# Patient Record
Sex: Female | Born: 1935 | Race: White | Hispanic: No | Marital: Married | Smoking: Never smoker
Health system: Southern US, Community
[De-identification: ages and names within clinical notes are randomized; demographics above are authoritative.]

## PROBLEM LIST (undated history)

## (undated) DIAGNOSIS — S83209A Unspecified tear of unspecified meniscus, current injury, unspecified knee, initial encounter: Secondary | ICD-10-CM

## (undated) DIAGNOSIS — R351 Nocturia: Secondary | ICD-10-CM

## (undated) DIAGNOSIS — R3915 Urgency of urination: Secondary | ICD-10-CM

## (undated) DIAGNOSIS — F0390 Unspecified dementia without behavioral disturbance: Secondary | ICD-10-CM

## (undated) HISTORY — PX: CATARACT EXTRACTION W/ INTRAOCULAR LENS  IMPLANT, BILATERAL: SHX1307

## (undated) HISTORY — PX: DILATION AND CURETTAGE OF UTERUS: SHX78

---

## 2006-03-02 ENCOUNTER — Ambulatory Visit: Payer: Self-pay | Admitting: Family Medicine

## 2013-02-18 ENCOUNTER — Other Ambulatory Visit: Payer: Self-pay | Admitting: Orthopedic Surgery

## 2013-02-24 ENCOUNTER — Encounter (HOSPITAL_BASED_OUTPATIENT_CLINIC_OR_DEPARTMENT_OTHER): Payer: Self-pay | Admitting: *Deleted

## 2013-02-24 NOTE — Progress Notes (Signed)
SPOKE W/ PT DAUGHTER, [name]. NPO AFTER MN WITH EXCEPTION CLEAR LIQUIDS UNTIL 0800 (NO CREAM/ MILK PRODUCTS). ARRIVES AT 1215. NEEDS HG.

## 2013-02-26 MED ORDER — DIAZEPAM 5 MG/ML IJ SOLN
INTRAMUSCULAR | Status: AC
  Filled 2013-02-26: qty 2

## 2013-03-04 ENCOUNTER — Ambulatory Visit (HOSPITAL_BASED_OUTPATIENT_CLINIC_OR_DEPARTMENT_OTHER): Payer: Medicare Other | Admitting: Anesthesiology

## 2013-03-04 ENCOUNTER — Ambulatory Visit (HOSPITAL_BASED_OUTPATIENT_CLINIC_OR_DEPARTMENT_OTHER)
Admission: RE | Admit: 2013-03-04 | Discharge: 2013-03-04 | Disposition: A | Discharge status: Home / Self Care | Payer: Medicare Other | Source: Ambulatory Visit | Attending: Orthopedic Surgery | Admitting: Orthopedic Surgery

## 2013-03-04 ENCOUNTER — Encounter (HOSPITAL_BASED_OUTPATIENT_CLINIC_OR_DEPARTMENT_OTHER): Payer: Self-pay | Admitting: Anesthesiology

## 2013-03-04 ENCOUNTER — Encounter (HOSPITAL_BASED_OUTPATIENT_CLINIC_OR_DEPARTMENT_OTHER)
Admission: RE | Disposition: A | Discharge status: Home / Self Care | Payer: Self-pay | Source: Ambulatory Visit | Attending: Orthopedic Surgery

## 2013-03-04 ENCOUNTER — Encounter (HOSPITAL_BASED_OUTPATIENT_CLINIC_OR_DEPARTMENT_OTHER): Payer: Self-pay | Admitting: *Deleted

## 2013-03-04 DIAGNOSIS — R351 Nocturia: Secondary | ICD-10-CM | POA: Insufficient documentation

## 2013-03-04 DIAGNOSIS — S83289A Other tear of lateral meniscus, current injury, unspecified knee, initial encounter: Secondary | ICD-10-CM | POA: Insufficient documentation

## 2013-03-04 DIAGNOSIS — Z7982 Long term (current) use of aspirin: Secondary | ICD-10-CM | POA: Insufficient documentation

## 2013-03-04 DIAGNOSIS — D649 Anemia, unspecified: Secondary | ICD-10-CM | POA: Insufficient documentation

## 2013-03-04 DIAGNOSIS — Z9889 Other specified postprocedural states: Secondary | ICD-10-CM

## 2013-03-04 DIAGNOSIS — IMO0002 Reserved for concepts with insufficient information to code with codable children: Secondary | ICD-10-CM | POA: Insufficient documentation

## 2013-03-04 DIAGNOSIS — R3915 Urgency of urination: Secondary | ICD-10-CM | POA: Insufficient documentation

## 2013-03-04 HISTORY — PX: KNEE ARTHROSCOPY WITH LATERAL MENISECTOMY: SHX6193

## 2013-03-04 HISTORY — DX: Urgency of urination: R39.15

## 2013-03-04 HISTORY — DX: Nocturia: R35.1

## 2013-03-04 HISTORY — DX: Unspecified tear of unspecified meniscus, current injury, unspecified knee, initial encounter: S83.209A

## 2013-03-04 SURGERY — ARTHROSCOPY, KNEE, WITH LATERAL MENISCECTOMY
Anesthesia: General | Site: Knee | Laterality: Left | Wound class: Clean

## 2013-03-04 MED ORDER — BUPIVACAINE-EPINEPHRINE 0.5% -1:200000 IJ SOLN
INTRAMUSCULAR | Status: DC | PRN
  Administered 2013-03-04: 20 mL

## 2013-03-04 MED ORDER — MORPHINE SULFATE 4 MG/ML IJ SOLN
INTRAMUSCULAR | Status: DC | PRN
  Administered 2013-03-04: 4 mg via INTRAVENOUS

## 2013-03-04 MED ORDER — POVIDONE-IODINE 7.5 % EX SOLN
Freq: Once | CUTANEOUS | Status: DC
  Filled 2013-03-04: qty 118

## 2013-03-04 MED ORDER — DEXAMETHASONE SODIUM PHOSPHATE 4 MG/ML IJ SOLN
INTRAMUSCULAR | Status: DC | PRN
  Administered 2013-03-04: 8 mg via INTRAVENOUS

## 2013-03-04 MED ORDER — SODIUM CHLORIDE 0.9 % IR SOLN
Status: DC | PRN
  Administered 2013-03-04: 16:00:00

## 2013-03-04 MED ORDER — ONDANSETRON HCL 4 MG/2ML IJ SOLN
INTRAMUSCULAR | Status: DC | PRN
  Administered 2013-03-04: 4 mg via INTRAVENOUS

## 2013-03-04 MED ORDER — SODIUM CHLORIDE 0.9 % IR SOLN
Status: DC | PRN
  Administered 2013-03-04: 3000 mL

## 2013-03-04 MED ORDER — LACTATED RINGERS IV SOLN
INTRAVENOUS | Status: DC
  Administered 2013-03-04: 13:00:00 via INTRAVENOUS
  Filled 2013-03-04: qty 1000

## 2013-03-04 MED ORDER — HYDROCODONE-ACETAMINOPHEN 5-325 MG PO TABS: 1.0000 | ORAL_TABLET | Freq: Four times a day (QID) | ORAL | Status: DC | PRN

## 2013-03-04 MED ORDER — LIDOCAINE HCL (CARDIAC) 20 MG/ML IV SOLN
INTRAVENOUS | Status: DC | PRN
  Administered 2013-03-04: 40 mg via INTRAVENOUS

## 2013-03-04 MED ORDER — PROPOFOL 10 MG/ML IV BOLUS
INTRAVENOUS | Status: DC | PRN
  Administered 2013-03-04: 80 mg via INTRAVENOUS

## 2013-03-04 MED ORDER — FENTANYL CITRATE 0.05 MG/ML IJ SOLN
INTRAMUSCULAR | Status: DC | PRN
  Administered 2013-03-04 (×3): 25 ug via INTRAVENOUS

## 2013-03-04 SURGICAL SUPPLY — 51 items
BANDAGE ELASTIC 6 VELCRO ST LF (GAUZE/BANDAGES/DRESSINGS) ×2 IMPLANT
BANDAGE ESMARK 6X9 LF (GAUZE/BANDAGES/DRESSINGS) ×1 IMPLANT
BANDAGE GAUZE ELAST BULKY 4 IN (GAUZE/BANDAGES/DRESSINGS) ×2 IMPLANT
BLADE 4.2CUDA (BLADE) IMPLANT
BLADE CUDA 5.5 (BLADE) IMPLANT
BLADE CUDA SHAVER 3.5 (BLADE) ×2 IMPLANT
BLADE CUTTER GATOR 3.5 (BLADE) IMPLANT
BLADE GREAT WHITE 4.2 (BLADE) IMPLANT
BNDG CMPR 9X6 STRL LF SNTH (GAUZE/BANDAGES/DRESSINGS) ×1
BNDG ESMARK 6X9 LF (GAUZE/BANDAGES/DRESSINGS) ×2
CANISTER SUCT LVC 12 LTR MEDI- (MISCELLANEOUS) ×2 IMPLANT
CANISTER SUCTION 1200CC (MISCELLANEOUS) ×2 IMPLANT
CLOTH BEACON ORANGE TIMEOUT ST (SAFETY) ×2 IMPLANT
DRAPE ARTHROSCOPY W/POUCH 114 (DRAPES) ×2 IMPLANT
DRAPE LG THREE QUARTER DISP (DRAPES) ×2 IMPLANT
DRSG EMULSION OIL 3X3 NADH (GAUZE/BANDAGES/DRESSINGS) ×2 IMPLANT
DRSG PAD ABDOMINAL 8X10 ST (GAUZE/BANDAGES/DRESSINGS) ×2 IMPLANT
DURAPREP 26ML APPLICATOR (WOUND CARE) ×2 IMPLANT
ELECT MENISCUS 165MM 90D (ELECTRODE) IMPLANT
ELECT REM PT RETURN 9FT ADLT (ELECTROSURGICAL)
ELECTRODE REM PT RTRN 9FT ADLT (ELECTROSURGICAL) IMPLANT
GLOVE BIO SURGEON STRL SZ 6.5 (GLOVE) ×2 IMPLANT
GLOVE BIO SURGEON STRL SZ7.5 (GLOVE) ×2 IMPLANT
GLOVE BIOGEL M STER SZ 6 (GLOVE) ×2 IMPLANT
GLOVE BIOGEL PI IND STRL 8 (GLOVE) ×1 IMPLANT
GLOVE BIOGEL PI INDICATOR 8 (GLOVE) ×1
GLOVE ECLIPSE 8.0 STRL XLNG CF (GLOVE) ×2 IMPLANT
GLOVE INDICATOR 8.0 STRL GRN (GLOVE) IMPLANT
GOWN PREVENTION PLUS LG XLONG (DISPOSABLE) ×2 IMPLANT
GOWN STRL REIN XL XLG (GOWN DISPOSABLE) ×2 IMPLANT
IV NS IRRIG 3000ML ARTHROMATIC (IV SOLUTION) ×6 IMPLANT
KNEE WRAP E Z 3 GEL PACK (MISCELLANEOUS) ×2 IMPLANT
NDL SAFETY ECLIPSE 18X1.5 (NEEDLE) ×1 IMPLANT
NEEDLE HYPO 18GX1.5 BLUNT FILL (NEEDLE) ×2 IMPLANT
NEEDLE HYPO 18GX1.5 SHARP (NEEDLE) ×1
PACK ARTHROSCOPY DSU (CUSTOM PROCEDURE TRAY) ×2 IMPLANT
PACK BASIN DAY SURGERY FS (CUSTOM PROCEDURE TRAY) ×2 IMPLANT
PADDING CAST ABS 4INX4YD NS (CAST SUPPLIES) ×1
PADDING CAST ABS COTTON 4X4 ST (CAST SUPPLIES) ×1 IMPLANT
PENCIL BUTTON HOLSTER BLD 10FT (ELECTRODE) IMPLANT
SET ARTHROSCOPY TUBING (MISCELLANEOUS) ×2
SET ARTHROSCOPY TUBING LN (MISCELLANEOUS) ×1 IMPLANT
SPONGE GAUZE 4X4 12PLY (GAUZE/BANDAGES/DRESSINGS) ×2 IMPLANT
SUT ETHIBOND 2 OS 4 DA (SUTURE) IMPLANT
SUT ETHILON 4 0 PS 2 18 (SUTURE) ×2 IMPLANT
SUT VIC AB 0 CT1 36 (SUTURE) IMPLANT
SUT VIC AB 2-0 PS2 27 (SUTURE) IMPLANT
SYRINGE 10CC LL (SYRINGE) ×2 IMPLANT
TOWEL OR 17X24 6PK STRL BLUE (TOWEL DISPOSABLE) ×2 IMPLANT
WAND 90 DEG TURBOVAC W/CORD (SURGICAL WAND) IMPLANT
WATER STERILE IRR 500ML POUR (IV SOLUTION) ×2 IMPLANT

## 2013-03-04 NOTE — Anesthesia Procedure Notes (Signed)
Procedure Name: LMA Insertion Date/Time: 03/04/2013 3:58 PM Performed by: Maris Berger T Pre-anesthesia Checklist: Patient identified, Emergency Drugs available, Suction available and Patient being monitored Patient Re-evaluated:Patient Re-evaluated prior to inductionOxygen Delivery Method: Circle System Utilized Preoxygenation: Pre-oxygenation with 100% oxygen Intubation Type: IV induction Ventilation: Mask ventilation without difficulty LMA: LMA inserted LMA Size: 4.0 Number of attempts: 1 Placement Confirmation: positive ETCO2 Dental Injury: Teeth and Oropharynx as per pre-operative assessment  Comments: Gauze roll between teeth

## 2013-03-04 NOTE — Anesthesia Postprocedure Evaluation (Signed)
  Anesthesia Post-op Note  Patient: Diane Santana  Procedure(s) Performed: Procedure(s) (LRB): LEFT KNEE ARTHROSCOPY PARTIAL MEDIAL AND LATERAL   MENISCECTOMY (Left)  Patient Location: PACU  Anesthesia Type: General  Level of Consciousness: awake and alert   Airway and Oxygen Therapy: Patient Spontanous Breathing  Post-op Pain: mild  Post-op Assessment: Post-op Vital signs reviewed, Patient's Cardiovascular Status Stable, Respiratory Function Stable, Patent Airway and No signs of Nausea or vomiting  Last Vitals:  Filed Vitals:   03/04/13 1745  BP: 144/62  Pulse: 90  Temp:   Resp: 20    Post-op Vital Signs: stable   Complications: No apparent anesthesia complications

## 2013-03-04 NOTE — H&P (Signed)
Diane Santana is an 79 y.o. female.   Chief Complaint:painful left knee HPIMRI demonstrates tears of both menisci  Past Medical History  Diagnosis Date  . Acute meniscal tear of knee     left knee  . Urgency of urination   . Nocturia     Past Surgical History  Procedure Laterality Date  . Cataract extraction w/ intraocular lens  implant, bilateral    . Dilation and curettage of uterus  2004 (APPROX)    History reviewed. No pertinent family history. Social History:  reports that she has never smoked. She has never used smokeless tobacco. She reports that she does not drink alcohol or use illicit drugs.  Allergies: No Known Allergies  Medications Prior to Admission  Medication Sig Dispense Refill  . acetaminophen (TYLENOL) 325 MG tablet Take 650 mg by mouth every 6 (six) hours as needed for pain.      Marland Kitchen aspirin 81 MG tablet Take 81 mg by mouth daily.        Results for orders placed during the hospital encounter of 03/04/13 (from the past 48 hour(s))  POCT HEMOGLOBIN-HEMACUE     Status: Abnormal   Collection Time    03/04/13  1:13 PM      Result Value Range   Hemoglobin 10.9 (*) 12.0 - 15.0 g/dL   No results found.  ROS  Blood pressure 121/62, pulse 92, temperature 97.2 F (36.2 C), temperature source Oral, height 5' 2.5" (1.588 m), weight 45.36 kg (100 lb), SpO2 99.00%. Physical Exam  Constitutional: She is oriented to person, place, and time. She appears well-developed and well-nourished.  HENT:  Head: Normocephalic and atraumatic.  Right Ear: External ear normal.  Left Ear: External ear normal.  Nose: Nose normal.  Mouth/Throat: Oropharynx is clear and moist.  Eyes: Conjunctivae and EOM are normal. Pupils are equal, round, and reactive to light.  Neck: Normal range of motion. Neck supple.  Cardiovascular: Normal rate, regular rhythm, normal heart sounds and intact distal pulses.   Respiratory: Effort normal and breath sounds normal.  GI: Soft. Bowel sounds are  normal.  Musculoskeletal: Normal range of motion. She exhibits tenderness.  Left knee--tender both joint lines.  Edema  Neurological: She is alert and oriented to person, place, and time. She has normal reflexes.  Skin: Skin is warm and dry.  Psychiatric: She has a normal mood and affect. Her behavior is normal. Judgment and thought content normal.     Assessment/Plan Torn medial and lateral menisci lt knee Lt knee arthroscopy with partial medial and lateral meniscectomies  APLINGTON,JAMES P 03/04/2013, 3:38 PM

## 2013-03-04 NOTE — Transfer of Care (Signed)
Immediate Anesthesia Transfer of Care Note  Patient: Diane Santana  Procedure(s) Performed: Procedure(s): LEFT KNEE ARTHROSCOPY PARTIAL MEDIAL AND LATERAL   MENISCECTOMY (Left)  Patient Location: PACU  Anesthesia Type:General  Level of Consciousness: sedated, patient cooperative and responds to stimulation  Airway & Oxygen Therapy: Patient Spontanous Breathing and Patient connected to nasal cannula oxygen  Post-op Assessment: Report given to PACU RN  Post vital signs: Reviewed and stable  Complications: No apparent anesthesia complications

## 2013-03-04 NOTE — Anesthesia Preprocedure Evaluation (Addendum)
Anesthesia Evaluation  Patient identified by MRN, date of birth, ID band Patient awake    Reviewed: Allergy & Precautions, H&P , NPO status , Patient's Chart, lab work & pertinent test results  Airway Mallampati: II TM Distance: >3 FB Neck ROM: full    Dental no notable dental hx. (+) Teeth Intact and Dental Advisory Given   Pulmonary neg pulmonary ROS,  breath sounds clear to auscultation  Pulmonary exam normal       Cardiovascular Exercise Tolerance: Good negative cardio ROS  Rhythm:regular Rate:Normal     Neuro/Psych negative neurological ROS  negative psych ROS   GI/Hepatic negative GI ROS, Neg liver ROS,   Endo/Other  negative endocrine ROS  Renal/GU negative Renal ROS  negative genitourinary   Musculoskeletal   Abdominal   Peds  Hematology negative hematology ROS (+) Blood dyscrasia, anemia , Hgb. 10.9   Anesthesia Other Findings   Reproductive/Obstetrics negative OB ROS                           Anesthesia Physical Anesthesia Plan  ASA: II  Anesthesia Plan: General   Post-op Pain Management:    Induction: Intravenous  Airway Management Planned: LMA  Additional Equipment:   Intra-op Plan:   Post-operative Plan:   Informed Consent: I have reviewed the patients History and Physical, chart, labs and discussed the procedure including the risks, benefits and alternatives for the proposed anesthesia with the patient or authorized representative who has indicated his/her understanding and acceptance.   Dental Advisory Given  Plan Discussed with: CRNA and Surgeon  Anesthesia Plan Comments:        Anesthesia Quick Evaluation

## 2013-03-05 ENCOUNTER — Encounter (HOSPITAL_BASED_OUTPATIENT_CLINIC_OR_DEPARTMENT_OTHER): Payer: Self-pay | Admitting: Orthopedic Surgery

## 2013-03-05 NOTE — Op Note (Signed)
NAME:  Diane Santana, Diane Santana NO.:  [id number]  MEDICAL RECORD NO.:  [id number]  LOCATION:                                 FACILITY:  PHYSICIAN:  Marlowe Kays, M.D.       DATE OF BIRTH:  DATE OF PROCEDURE: DATE OF DISCHARGE:                              OPERATIVE REPORT   PREOPERATIVE DIAGNOSIS:  Torn medial and lateral menisci, left knee.  POSTOPERATIVE DIAGNOSIS:  Torn medial and lateral menisci, left knee.  OPERATION:  Left knee arthroscopy with, 1. Partial medial and lateral meniscectomies 2. Shaving of medial femoral condyle.  SURGEON:  Marlowe Kays, M.D.  ASSISTANT:  Nurse.  ANESTHESIA:  General.  PATHOLOGY AND JUSTIFICATION FOR PROCEDURE:  Painful left knee with the MRI demonstrating the tears of both menisci.  PROCEDURE IN DETAIL:  Satisfactory general anesthesia, Ace wrap and leg support to right lower extremity.  Pneumatic tourniquet applied to the left lower extremity with a leg Esmarch out nonsterilely.  Thigh stabilizer applied.  The left leg was then prepped with DuraPrep from stabilizer to ankle and draped in sterile field.  Time-out performed. Superior medial saline inflow.  First, through an anterolateral portal, medial compartment joint was evaluated.  She had very slight wear of the medial femoral condyle which I gently smoothed down.  She had a small tear of the posterior curve of the medial meniscus which I trimmed back to a stable rim.  ACL was intact.  Looking up in suprapatellar area medially, her patella had minimal wear.  I then reversed portals.  Most of her pathology was in the lateral knee for she had a bucket-handle tear of the anterior horn and tearing of the remainder of the medial meniscus which I trimmed back to stable rim with a combination of baskets and a 3.5 shaver.  The joint was then irrigated to clear all fluid possible was removed.  I closed the 2 entry portals with 4-0 nylon and then injected 20 mL of 0.5%  Marcaine with adrenaline and 4 mg of morphine through the inflow apparatus, and I closed this portal with 4-0 nylon as well.  Betadine, Adaptic, dry sterile dressing were applied.  Tourniquet was released. She tolerated the procedure well and was taken to the recovery room in satisfactory condition with no known complications.          ______________________________ Marlowe Kays, M.D.     JA/MEDQ  D:  03/04/2013  T:  03/05/2013  Job:  [id number]

## 2013-04-09 ENCOUNTER — Telehealth: Payer: Self-pay | Admitting: General Practice

## 2013-04-09 ENCOUNTER — Encounter: Payer: Self-pay | Admitting: General Practice

## 2013-04-09 ENCOUNTER — Ambulatory Visit (INDEPENDENT_AMBULATORY_CARE_PROVIDER_SITE_OTHER): Payer: Medicare Other | Admitting: General Practice

## 2013-04-09 VITALS — BP 128/67 | HR 87 | Temp 96.9°F | Ht 60.0 in | Wt 96.0 lb

## 2013-04-09 DIAGNOSIS — W57XXXA Bitten or stung by nonvenomous insect and other nonvenomous arthropods, initial encounter: Secondary | ICD-10-CM

## 2013-04-09 DIAGNOSIS — T148 Other injury of unspecified body region: Secondary | ICD-10-CM

## 2013-04-09 MED ORDER — DOXYCYCLINE HYCLATE 100 MG PO TABS: ORAL_TABLET | ORAL | Status: DC

## 2013-04-09 MED ORDER — PERMETHRIN 5 % EX CREA: TOPICAL_CREAM | Freq: Once | CUTANEOUS | Status: DC

## 2013-04-09 NOTE — Telephone Encounter (Signed)
appt given  

## 2013-04-09 NOTE — Patient Instructions (Signed)
Deer Tick Bite Deer ticks are [place]andow arachnids (spider family) that vary in size from as small as the head of a pin to 1/4 inch (1/2 cm) diameter. They thrive in wooded areas. Deer are the preferred host of adult deer ticks. Small rodents are the host of young ticks (nymphs). When a person walks in a field or wooded area, young and adult ticks in the surrounding grass and vegetation can attach themselves to the skin. They can suck blood for hours to days if unnoticed. Ticks are found all over the [place] Some ticks carry a specific bacteria (Borrelia burgdorferi) that causes an infection called Lyme disease. The bacteria is typically passed into a person during the blood sucking process. This happens after the tick has been attached for at least a number of hours. While ticks can be found all over the [place] those carrying the bacteria that causes Lyme disease are most common in [place] and the [place]. Only a small proportion of ticks in these areas carry the Lyme disease bacteria and cause human infections. Ticks usually attach to warm spots on the body, such as the:  Head.  Back.  Neck.  Armpits.  Groin. SYMPTOMS  Most of the time, a deer tick bite will not be felt. You may or may not see the attached tick. You may notice mild irritation or redness around the bite site. If the deer tick passes the Lyme disease bacteria to a person, a round, red rash may be noticed 2 to 3 days after the bite. The rash may be clear in the middle, like a bull's-eye or target. If not treated, other symptoms may develop several days to weeks after the onset of the rash. These symptoms may include:  New rash lesions.  Fatigue and weakness.  General ill feeling and achiness.  Chills.  Headache and neck pain.  Swollen lymph glands.  Sore muscles and joints. 5 to 15% of untreated people with Lyme disease may develop more severe illnesses after several weeks to months. This may include inflammation of the  brain lining (meningitis), nerve palsies, an abnormal heartbeat, or severe muscle and joint pain and inflammation (myositis or arthritis). DIAGNOSIS   Physical exam and medical history.  Viewing the tick if it was saved for confirmation.  Blood tests (to check or confirm the presence of Lyme disease). TREATMENT  Most ticks do not carry disease. If found, an attached tick should be removed using tweezers. Tweezers should be placed under the body of the tick so it is removed by its attachment parts (pincers). If there are signs or symptoms of being sick, or Lyme disease is confirmed, medicines (antibiotics) that kill germs are usually prescribed. In more severe cases, antibiotics may be given through an intravenous (IV) access. HOME CARE INSTRUCTIONS   Always remove ticks with tweezers. Do not use petroleum jelly or other methods to kill or remove the tick. Slide the tweezers under the body and pull out as much as you can. If you are not sure what it is, save it in a jar and show your caregiver.  Once you remove the tick, the skin will heal on its own. Wash your hands and the affected area with water and soap. You may place a bandage on the affected area.  Take medicine as directed. You may be advised to take a full course of antibiotics.  Follow up with your caregiver as recommended. FINDING OUT THE RESULTS OF YOUR TEST Not all test results are available   during your visit. If your test results are not back during the visit, make an appointment with your caregiver to find out the results. Do not assume everything is normal if you have not heard from your caregiver or the medical facility. It is important for you to follow up on all of your test results. PROGNOSIS  If Lyme disease is confirmed, early treatment with antibiotics is very effective. Following preventive guidelines is important since it is possible to get the disease more than once. PREVENTION   Wear long sleeves and long pants in  wooded or grassy areas. Tuck your pants into your socks.  Use an insect repellent while hiking.  Check yourself, your children, and your pets regularly for ticks after playing outside.  Clear piles of leaves or brush from your yard. Ticks might live there. SEEK MEDICAL CARE IF:   You or your child has an oral temperature above 102 F (38.9 C).  You develop a severe headache following the bite.  You feel generally ill.  You notice a rash.  You are having trouble removing the tick.  The bite area has red skin or yellow drainage. SEEK IMMEDIATE MEDICAL CARE IF:   Your face is weak and droopy or you have other neurological symptoms.  You have severe joint pain or weakness. MAKE SURE YOU:   Understand these instructions.  Will watch your condition.  Will get help right away if you are not doing well or get worse. FOR MORE INFORMATION Centers for Disease Control and Prevention: www.cdc.gov American Academy of Family Physicians: www.aafp.org Document Released: 01/24/2010 Document Revised: 01/22/2012 Document Reviewed: 01/24/2010 Exit[employer] Patient Information 2014 [employer].  

## 2013-04-09 NOTE — Progress Notes (Signed)
  Subjective:    Patient ID: Diane Santana, female    DOB: November 22, 1925, 79 y.o.   MRN: [id number]  HPI Presents today reporting small red, rash area on her bilateral arms, stomach, back, and legs. Reports noticing rash Monday and now has a scab ([place]) on all rashed areas . Denies OTC medications or creams. Denies rash being itchy, only red area with scabbed center. Reports being out planting flowers on Monday afternoon. Also reports having the sensation of ears being clogged. Reports taking OTC allergy medication, but hasn't taken in past few weeks.      Review of Systems  Constitutional: Negative for fever, chills and fatigue.  HENT: Negative for ear pain, congestion, sore throat, rhinorrhea, neck pain, postnasal drip, sinus pressure and ear discharge.   Eyes: Negative for pain, redness and itching.  Respiratory: Negative for cough, chest tightness and shortness of breath.   Cardiovascular: Negative for chest pain and palpitations.  Genitourinary: Negative for dysuria, hematuria and difficulty urinating.  Musculoskeletal: Negative for myalgias, joint swelling and gait problem.  Skin: Positive for rash.  Neurological: Negative for dizziness, weakness, light-headedness, numbness and headaches.  Psychiatric/Behavioral: Negative.        Objective:   Physical Exam  Constitutional: She is oriented to person, place, and time. She appears well-developed and well-nourished.  HENT:  Head: Normocephalic and atraumatic.  Right Ear: External ear normal.  Left Ear: External ear normal.  Mouth/Throat: Oropharynx is clear and moist.  Eyes: Conjunctivae are normal.  Neck: Normal range of motion.  Cardiovascular: Normal rate, regular rhythm and normal heart sounds.   Pulmonary/Chest: Effort normal and breath sounds normal. No respiratory distress. She exhibits no tenderness.  Abdominal: Soft. Bowel sounds are normal. She exhibits no distension and no mass. There is no tenderness. There is no rebound  and no guarding.  Neurological: She is alert and oriented to person, place, and time.  Skin: Skin is warm. There is erythema.  Multiple erythematous, maculopapular areas (size of pencil eraser), with Nephew center noted to bilateral arms and legs. Also abdomen, back, buttocks.   The [place] center of each erythematous was a tick.   Psychiatric: She has a normal mood and affect.          Assessment & Plan:  1. Tick bite of multiple sites -Forty three ticks were found and removed (fully intact) from multiple areas of body.  -Cleansed each area with saline and perioxide - doxycycline (VIBRA-TABS) 100 MG tablet; Take one tablet twice a day for one day, then one tablet daily for 14 days.  Dispense: 16 tablet; Refill: 0 - permethrin (ELIMITE) 5 % cream; Apply topically once. Apply cream from head to soles of feet, avoid eye contact. Leave on for 8-14 hours, then wash off with soap and water.  Dispense: 60 g; Refill: 0 - B. burgdorfi antibodies - [place] mtn spotted fvr abs pnl(IgG+IgM) -RTO on Friday and sooner if symptoms discussed develop or current worsen -refrain from scratching -Continue taking OTC allergy medication (for ears clogged sensation) -if ears are unresolved will address further Patient and daughter verbalized understanding Coralie Keens, FNP-C

## 2013-04-10 LAB — B. BURGDORFI ANTIBODIES: B burgdorferi Ab IgG+IgM: 0.3 {ISR}

## 2013-04-10 LAB — ROCKY MTN SPOTTED FVR ABS PNL(IGG+IGM): RMSF IgG: 0.22 IV

## 2013-04-11 ENCOUNTER — Encounter: Payer: Self-pay | Admitting: General Practice

## 2013-04-11 ENCOUNTER — Ambulatory Visit (INDEPENDENT_AMBULATORY_CARE_PROVIDER_SITE_OTHER): Payer: Medicare Other | Admitting: General Practice

## 2013-04-11 VITALS — BP 120/65 | HR 77 | Temp 97.1°F | Ht 60.0 in | Wt 96.5 lb

## 2013-04-11 DIAGNOSIS — T148 Other injury of unspecified body region: Secondary | ICD-10-CM

## 2013-04-11 DIAGNOSIS — Z09 Encounter for follow-up examination after completed treatment for conditions other than malignant neoplasm: Secondary | ICD-10-CM

## 2013-04-11 NOTE — Progress Notes (Signed)
Patient ID: Diane Santana, female   DOB: [birth year], 79 y.o.   MRN: [id number]  S: Patient presents today for follow up of multiple tick bite. Reports taking medication as directed. Denies any additional problems.   O: Denies headache, malaise, nausea, vomiting, fever, shortness of breath, chest pain, heart palpations, muscle aches, change in bowel or bladder habits. Reports small red area to multiple sites of body. Denies drainage from red area or warm to touch.   A: Normocephalic, Atraumatic, clear bilateral breath sounds, heart-regular rate and rhythm. Resolving erythematous based macular areas noted to bilateral arm, legs. Negative warmth and drainage from these areas.  P: Follow up (tick bites) -Continue current antibiotics -keep affected area clean and dry -RTO if symptoms worsen -Patient verbalized understanding -Coralie Keens, FNP-C

## 2013-10-06 ENCOUNTER — Ambulatory Visit (INDEPENDENT_AMBULATORY_CARE_PROVIDER_SITE_OTHER): Payer: Medicare Other | Admitting: Nurse Practitioner

## 2013-10-06 ENCOUNTER — Encounter: Payer: Self-pay | Admitting: Nurse Practitioner

## 2013-10-06 VITALS — BP 110/60 | HR 86 | Temp 98.0°F | Ht 60.0 in | Wt 93.0 lb

## 2013-10-06 DIAGNOSIS — H9190 Unspecified hearing loss, unspecified ear: Secondary | ICD-10-CM | POA: Insufficient documentation

## 2013-10-06 DIAGNOSIS — H6990 Unspecified Eustachian tube disorder, unspecified ear: Secondary | ICD-10-CM | POA: Insufficient documentation

## 2013-10-06 DIAGNOSIS — R32 Unspecified urinary incontinence: Secondary | ICD-10-CM

## 2013-10-06 DIAGNOSIS — L308 Other specified dermatitis: Secondary | ICD-10-CM

## 2013-10-06 DIAGNOSIS — H6993 Unspecified Eustachian tube disorder, bilateral: Secondary | ICD-10-CM

## 2013-10-06 DIAGNOSIS — L400 Psoriasis vulgaris: Secondary | ICD-10-CM | POA: Insufficient documentation

## 2013-10-06 DIAGNOSIS — R634 Abnormal weight loss: Secondary | ICD-10-CM | POA: Insufficient documentation

## 2013-10-06 DIAGNOSIS — L408 Other psoriasis: Secondary | ICD-10-CM

## 2013-10-06 DIAGNOSIS — F0390 Unspecified dementia without behavioral disturbance: Secondary | ICD-10-CM

## 2013-10-06 MED ORDER — TRIAMCINOLONE ACETONIDE 0.025 % EX OINT: 1.0000 "application " | TOPICAL_OINTMENT | Freq: Two times a day (BID) | CUTANEOUS | Status: DC

## 2013-10-06 NOTE — Patient Instructions (Addendum)
Our office will call you with lab results. Start using the steroid cream on rash twice daily. Start steroid nasal spray and daily sinus rinses (Neilmed Sinus Rinse). You have some mild dementia. Please work with family & friends to ensure your environment is safe-reminders for turning off oven & stove.  I have referred you to an audiologist to check your hearing.  Remember to stay on a toilet schedule to prevent incontinence-go to bathroom every 2 or 3 hours, even if you do not feel you have to. Consider going to silver sneakers exercise program at [facility] in [place]. Pleasure to meet you! See you in about 35month.

## 2013-10-06 NOTE — Progress Notes (Signed)
Subjective:     Diane Santana is a 79 y.o. female who presents to establish care. She is accompanied by her daughter who is concerned about multiple complaints: Rash involving the elbow and lumbar region. Rash started a few weeks ago. Lesions are pink, and raised in texture. Rash has not changed over time. Rash is pruritic. Associated symptoms: none. Patient denies: abdominal pain, cough, fever, headache, nausea, sore throat and vomiting. Patient has not had contacts with similar rash. Patient has not had new exposures (soaps, lotions, laundry detergents, foods, medications, plants, insects or animals). Hearing loss-has to ask to repeat some phrases. Ear fullness-some rhinitis, throat clearing, ceruminosis in past. Urinary incontinence-wears depends, has nocturia.   The following portions of the patient's history were reviewed and updated as appropriate: allergies, current medications, past family history, past medical history, past social history, past surgical history and problem list.  Review of Systems Constitutional: positive for weight loss, negative for fevers, night sweats and lives independently, drives within few miles of home to grocery, church, [employer]. Eyes: positive for contacts/glasses Ears, nose, mouth, throat, and face: positive for nasal congestion Respiratory: negative for pleurisy/chest pain, sputum and wheezing Cardiovascular: negative for chest pain, chest pressure/discomfort, irregular heart beat, lower extremity edema and near-syncope Gastrointestinal: positive for constipation and reports BM every few days, sometimes takes stool softener Genitourinary:positive for nocturia and urinary incontinence Integument/breast: positive for rash Musculoskeletal:negative, Hx of meniscal repair 6 mos ago Neurological: positive for memory problems Behavioral/Psych: negative for aggressive behavior, behavior problems, excessive alcohol consumption, illegal drug usage, sleep  disturbance and tobacco use    Objective:    BP 110/60  Pulse 86  Temp(Src) 98 F (36.7 C) (Oral)  Ht 5' (1.524 m)  Wt 93 lb (42.185 kg)  BMI 18.16 kg/m2  SpO2 97% General:  alert, cooperative, appears stated age and no distress  Skin :  normal and red raised patchy scaly rash on R elbow & sacrum    HENT: Ext ear canals clear, NML TMs, no obvious effusion, but sniffling several times in office. Throat clear, tonsils nml.  Eyes: lids & lashes clear. PERRL. Extremities: no edema. Pulses +2. R toes-great toe nail scraping 2nd toe. Neuro: Mini Mental Exam Score: 25, mild dementia   Assessment:    atopic dermatitis  Urinary incontinence Mild dementia Hard of hearing Ear fullness, sinus drainage     Plan:    Medications: triamcinolone. avoid soap on rash, water only Toilet schedule q2h Discussed safety issues re: cooking, driving, remove throw rugs Ref to audiologist Flonase & sinus rinses

## 2013-10-06 NOTE — Progress Notes (Signed)
Pre-visit discussion using our clinic review tool. No additional management support is needed unless otherwise documented below in the visit note.  

## 2013-10-14 ENCOUNTER — Other Ambulatory Visit: Payer: Self-pay | Admitting: Nurse Practitioner

## 2013-10-14 LAB — HEPATIC FUNCTION PANEL
ALT: 12 U/L (ref 0–35)
AST: 19 U/L (ref 0–37)
Alkaline Phosphatase: 65 U/L (ref 39–117)
Bilirubin, Direct: 0.1 mg/dL (ref 0.0–0.3)
Indirect Bilirubin: 0.6 mg/dL (ref 0.0–0.9)
Total Protein: 5.9 g/dL — ABNORMAL LOW (ref 6.0–8.3)

## 2013-10-14 LAB — CBC
MCH: 32 pg (ref 26.0–34.0)
MCHC: 34.4 g/dL (ref 30.0–36.0)
Platelets: 159 10*3/uL (ref 150–400)
RBC: 3.25 MIL/uL — ABNORMAL LOW (ref 3.87–5.11)
RDW: 14.6 % (ref 11.5–15.5)

## 2013-10-14 LAB — URINALYSIS, ROUTINE W REFLEX MICROSCOPIC
Glucose, UA: NEGATIVE mg/dL
Protein, ur: NEGATIVE mg/dL
pH: 5.5 (ref 5.0–8.0)

## 2013-10-14 LAB — RENAL FUNCTION PANEL
BUN: 24 mg/dL — ABNORMAL HIGH (ref 6–23)
CO2: 26 mEq/L (ref 19–32)
Chloride: 108 mEq/L (ref 96–112)
Glucose, Bld: 88 mg/dL (ref 70–99)
Potassium: 4 mEq/L (ref 3.5–5.3)

## 2013-10-15 ENCOUNTER — Telehealth: Payer: Self-pay | Admitting: Nurse Practitioner

## 2013-10-15 LAB — URINALYSIS, MICROSCOPIC ONLY: Casts: NONE SEEN

## 2013-10-15 LAB — IRON AND TIBC
%SAT: 39 % (ref 20–55)
Iron: 98 ug/dL (ref 42–145)
TIBC: 252 ug/dL (ref 250–470)
UIBC: 154 ug/dL (ref 125–400)

## 2013-10-15 NOTE — Telephone Encounter (Signed)
Anemia-checl iron studies & b12 screen, has hematuria  Leukopenia Elevated BUN Hematuria Albumin slightly low -recmd 1 can Ensure daily.  LM w/daughter.

## 2013-10-16 NOTE — Telephone Encounter (Signed)
Called patient's daughter Okey Regal, there was no answer at either number. Will try back again later.

## 2013-10-16 NOTE — Telephone Encounter (Signed)
Diane Santana (daughter) was given results and advise that patient should drink 1 Ensure daily.

## 2013-10-17 ENCOUNTER — Telehealth: Payer: Self-pay | Admitting: *Deleted

## 2013-10-17 LAB — METHYLMALONIC ACID, SERUM

## 2013-10-17 NOTE — Telephone Encounter (Signed)
What can she do? Cancel the order if there is not enough blood.

## 2013-10-17 NOTE — Telephone Encounter (Signed)
Katrina from [facility] called to let Layne know that the lab that was added to patient's order was incomplete due to not enough sample. Katrina would like advise on what Layne wants her to do?

## 2013-10-21 ENCOUNTER — Telehealth: Payer: Self-pay | Admitting: Nurse Practitioner

## 2013-10-21 NOTE — Telephone Encounter (Signed)
Order is for hearing eval.

## 2013-10-21 NOTE — Telephone Encounter (Signed)
Daughter schedule an appointment with [facility] and they are requesting orders to be faxed over, patients appointment is scheduled for Monday 11/17/12

## 2013-10-21 NOTE — Telephone Encounter (Signed)
Labs complete.

## 2013-10-21 NOTE — Telephone Encounter (Signed)
Please advise 

## 2013-10-21 NOTE — Telephone Encounter (Signed)
Orders faxed to [facility].

## 2013-10-23 ENCOUNTER — Telehealth: Payer: Self-pay | Admitting: Nurse Practitioner

## 2013-10-23 NOTE — Telephone Encounter (Signed)
LMOVM for daughter to return call concerning results.

## 2013-10-23 NOTE — Telephone Encounter (Signed)
Iron studies nml. Unable to run B12 to further eval anemia. Will check urine in 1 mo & stool for blood.

## 2013-10-30 ENCOUNTER — Ambulatory Visit: Payer: Medicare Other | Admitting: Nurse Practitioner

## 2013-10-30 ENCOUNTER — Encounter: Payer: Self-pay | Admitting: Nurse Practitioner

## 2013-10-30 ENCOUNTER — Ambulatory Visit (INDEPENDENT_AMBULATORY_CARE_PROVIDER_SITE_OTHER): Payer: Medicare Other | Admitting: Nurse Practitioner

## 2013-10-30 VITALS — BP 110/60 | HR 83 | Temp 97.6°F | Ht 60.0 in | Wt 96.0 lb

## 2013-10-30 DIAGNOSIS — R634 Abnormal weight loss: Secondary | ICD-10-CM

## 2013-10-30 DIAGNOSIS — H6993 Unspecified Eustachian tube disorder, bilateral: Secondary | ICD-10-CM

## 2013-10-30 DIAGNOSIS — H9193 Unspecified hearing loss, bilateral: Secondary | ICD-10-CM

## 2013-10-30 DIAGNOSIS — L408 Other psoriasis: Secondary | ICD-10-CM

## 2013-10-30 DIAGNOSIS — H6990 Unspecified Eustachian tube disorder, unspecified ear: Secondary | ICD-10-CM

## 2013-10-30 DIAGNOSIS — L308 Other specified dermatitis: Secondary | ICD-10-CM

## 2013-10-30 DIAGNOSIS — R32 Unspecified urinary incontinence: Secondary | ICD-10-CM

## 2013-10-30 DIAGNOSIS — L400 Psoriasis vulgaris: Secondary | ICD-10-CM

## 2013-10-30 DIAGNOSIS — H9190 Unspecified hearing loss, unspecified ear: Secondary | ICD-10-CM

## 2013-10-30 MED ORDER — TRIAMCINOLONE ACETONIDE 0.5 % EX OINT: 1.0000 "application " | TOPICAL_OINTMENT | Freq: Two times a day (BID) | CUTANEOUS | Status: DC

## 2013-10-30 NOTE — Progress Notes (Signed)
Pre-visit discussion using our clinic review tool. No additional management support is needed unless otherwise documented below in the visit note.  

## 2013-10-30 NOTE — Progress Notes (Signed)
Subjective:     SAMANTH MIRKIN is a 79 y.o. female who presents for follow up of multiple concerns: hearing loss, rash, urinary incontinence, and eustacian tube dysfunction. In addition, we discussed mildly low hemoglobin. See review of symptoms.   The following portions of the patient's history were reviewed and updated as appropriate: allergies, current medications, past family history, past medical history, past social history, past surgical history and problem list.  Review of Systems Constitutional: negative for chills, fatigue, fevers, night sweats, weight loss and gained 4 pounds since started ensure daily Ears, nose, mouth, throat, and face: negative for hoarseness and nasal congestion Respiratory: negative for cough, sputum and wheezing Cardiovascular: negative for chest pain, chest pressure/discomfort, irregular heart beat, lower extremity edema and near-syncope Gastrointestinal: negative for abdominal pain, change in bowel habits and reflux symptoms Genitourinary:positive for nocturia and urinary incontinence, negative for dysuria, frequency, hematuria and hesitancy Integument/breast: positive for rash and R elbow & R posterior pelvis    Objective:    BP 110/60  Pulse 83  Temp(Src) 97.6 F (36.4 C) (Oral)  Ht 5' (1.524 m)  Wt 96 lb (43.545 kg)  BMI 18.75 kg/m2  SpO2 98% General:  alert, cooperative, appears stated age, no distress and slow to anser questions-takes few seconds. Often looks to daughter to confirm her statements, as if needs reassurance. Answers all questions appropriately.  Skin:  normal and rash R elbow: raised plague, edges defined, pink, no scale after steroid cream, approx 3-4 cm. R posterior hip: same appearance & size   Heart: no murmur, RRR Lungs: clear bilat  Assessment:    1 plague psoriasis DD: cutaneous t-cell lymphoma 2 low hemoglobin, stable. Iron studies normal 3 weight loss, stabilized w/daily ensure 4 eustacian tube dysfunction, better w/  sinus rinses  Plan:    1 improvement w/steroid cream but persistent, will increase strength 2 pt does not wish to have further testing now  3 Continue daily ensure 4 Continue sinus rinses

## 2013-10-30 NOTE — Telephone Encounter (Signed)
Patient's daughter stated that they have been on vacation and that they will just get the results at patient's appointment.

## 2013-10-30 NOTE — Patient Instructions (Signed)
I increased the strength of your steroid cream. Continue to use on rash twice daily. Do not use soap on rash, just plain water or mild bar soap, like Dove. Continue to use sinus rinses for nasal congestion. Continue to drink ensure daily. Great to see you!  Psoriasis Psoriasis is a common, long-lasting (chronic) inflammation of the skin. It affects both men and women equally, of all ages and all races. Psoriasis cannot be passed from person to person (not contagious). Psoriasis varies from mild to very severe. When severe, it can greatly affect your quality of life. Psoriasis is an inflammatory disorder affecting the skin as well as other organs including the joints (causing an arthritis). With psoriasis, the skin sheds its top layer of cells more rapidly than it does in someone without psoriasis. CAUSES  The cause of psoriasis is largely unknown. Genetics, your immune system, and the environment seem to play a role in causing psoriasis. Factors that can make psoriasis worse include:  Damage or trauma to the skin, such as cuts, scrapes, and sunburn. This damage often causes new areas of psoriasis (lesions).  Winter dryness and lack of sunlight.  Medicines such as lithium, beta-blockers, antimalarial drugs, ACE inhibitors, nonsteroidal anti-inflammatory drugs (ibuprofen, aspirin), and terbinafine. Let your caregiver know if you are taking any of these drugs.  Alcohol. Excessive alcohol use should be avoided if you have psoriasis. Drinking large amounts of alcohol can affect:  How well your psoriasis treatment works.  How safe your psoriasis treatment is.  Smoking. If you smoke, ask your caregiver for help to quit.  Stress.  Bacterial or viral infections.  Arthritis. Arthritis associated with psoriasis (psoriatic arthritis) affects less than 10% of patients with psoriasis. The arthritic intensity does not always match the skin psoriasis intensity. It is important to let your caregiver know if  your joints hurt or if they are stiff. SYMPTOMS  The most common form of psoriasis begins with little red bumps that gradually become larger. The bumps begin to form scales that flake off easily. The lower layers of scales stick together. When these scales are scratched or removed, the underlying skin is tender and bleeds easily. These areas then grow in size and may become large. Psoriasis often creates a rash that looks the same on both sides of the body (symmetrical). It often affects the elbows, knees, groin, genitals, arms, legs, scalp, and nails. Affected nails often have pitting, loosen, thicken, crumble, and are difficult to treat.  "Inverse psoriasis"occurs in the armpits, under breasts, in skin folds, and around the groin, buttocks, and genitals.  "Guttate psoriasis" generally occurs in children and young adults following a recent sore throat (strep throat). It begins with many small, red, scaly spots on the skin. It clears spontaneously in weeks or a few months without treatment. DIAGNOSIS  Psoriasis is diagnosed by physical exam. A tissue sample (biopsy) may also be taken. TREATMENT The treatment of psoriasis depends on your age, health, and living conditions.  Steroid (cortisone) creams, lotions, and ointments may be used. These treatments are associated with thinning of the skin, blood vessels that get larger (dilated), loss of skin pigmentation, and easy bruising. It is important to use these steroids as directed by your caregiver. Only treat the affected areas and not the normal, unaffected skin. People on long-term steroid treatment should wear a medical alert bracelet. Injections may be used in areas that are difficult to treat.  Scalp treatments are available as shampoos, solutions, sprays, foams, and oils. Avoid  scratching the scalp and picking at the scales.  Anthralin medicine works well on areas that are difficult to treat. However, it stains clothes and skin and may cause  temporary irritation.  Synthetic vitamin D (calcipotriene)can be used on small areas. It is available by prescription. The forms of synthetic vitamin D available in health food stores do not help with psoriasis.  Coal tarsare available in various strengths for psoriasis that is difficult to treat. They are one of the longest used treatments for difficult to treat psoriasis. However, they are messy to use.  Light therapy (UV therapy) can be carefully and professionally monitored in a dermatologist's office. Careful sunbathing is helpful for many people as directed by your caregiver. The exposure should be just long enough to cause a mild redness (erythema) of your skin. Avoid sunburn as this may make the condition worse. Sunscreen (SPF of 30 or higher) should be used to protect against sunburn. Cataracts, wrinkles, and skin aging are some of the harmful side effects of light therapy.  If creams (topical medicines) fail, there are several other options for systemic or oral medicines your caregiver can suggest. Psoriasis can sometimes be very difficult to treat. It can come and go. It is necessary to follow up with your caregiver regularly if your psoriasis is difficult to treat. Usually, with persistence you can get a good amount of relief. Maintaining consistent care is important. Do not change caregivers just because you do not see immediate results. It may take several trials to find the right combination of treatment for you. PREVENTING FLARE-UPS  Wear gloves while you wash dishes, while cleaning, and when you are outside in the cold.  If you have radiators, place a bowl of water or damp towel on the radiator. This will help put water back in the air. You can also use a humidifier to keep the air moist. Try to keep the humidity at about 60% in your home.  Apply moisturizer while your skin is still damp from bathing or showering. This traps water in the skin.  Avoid long, hot baths or showers.  Keep soap use to a minimum. Soaps dry out the skin and wash away the protective oils. Use a fragrance free, dye free soap.  Drink enough water and fluids to keep your urine clear or pale yellow. Not drinking enough water depletes your skin's water supply.  Turn off the heat at night and keep it low during the day. Cool air is less drying. SEEK MEDICAL CARE IF:  You have increasing pain in the affected areas.  You have uncontrolled bleeding in the affected areas.  You have increasing redness or warmth in the affected areas.  You start to have pain or stiffness in your joints.  You start feeling depressed about your condition.  You have a fever. Document Released: 10/27/2000 Document Revised: 01/22/2012 Document Reviewed: 04/24/2011 [facility] Patient Information 2014 [place], [place].

## 2013-10-31 NOTE — Assessment & Plan Note (Signed)
Continue to use saline rinses.

## 2013-10-31 NOTE — Assessment & Plan Note (Signed)
Continue 1 can ensure daily.

## 2013-10-31 NOTE — Assessment & Plan Note (Signed)
Increased strength of steroid cream from 0.025% to 0.5%. She has had improvement on 0.025 %: rash is less scaly, pruritic & smaller, but persistent. DD: cutaneous t-cell lymphoma

## 2013-10-31 NOTE — Assessment & Plan Note (Signed)
Audiology appointment next month-1/'15.

## 2013-10-31 NOTE — Assessment & Plan Note (Signed)
Bladder training: toilet schedule every 2 hours. Decrease fluids after 6 pm for nocturia. Discussed medical mangmt-DDAVP, vesicare. Pt declined, daughter in agreement.

## 2014-04-30 ENCOUNTER — Ambulatory Visit (INDEPENDENT_AMBULATORY_CARE_PROVIDER_SITE_OTHER): Payer: Medicare Other | Admitting: Nurse Practitioner

## 2014-04-30 ENCOUNTER — Encounter: Payer: Self-pay | Admitting: Nurse Practitioner

## 2014-04-30 VITALS — BP 118/67 | HR 79 | Temp 97.6°F | Ht 60.0 in | Wt 90.0 lb

## 2014-04-30 DIAGNOSIS — R634 Abnormal weight loss: Secondary | ICD-10-CM

## 2014-04-30 DIAGNOSIS — D649 Anemia, unspecified: Secondary | ICD-10-CM | POA: Insufficient documentation

## 2014-04-30 DIAGNOSIS — H9190 Unspecified hearing loss, unspecified ear: Secondary | ICD-10-CM

## 2014-04-30 DIAGNOSIS — L408 Other psoriasis: Secondary | ICD-10-CM

## 2014-04-30 DIAGNOSIS — R32 Unspecified urinary incontinence: Secondary | ICD-10-CM

## 2014-04-30 DIAGNOSIS — F0390 Unspecified dementia without behavioral disturbance: Secondary | ICD-10-CM

## 2014-04-30 DIAGNOSIS — L400 Psoriasis vulgaris: Secondary | ICD-10-CM

## 2014-04-30 DIAGNOSIS — R490 Dysphonia: Secondary | ICD-10-CM | POA: Insufficient documentation

## 2014-04-30 DIAGNOSIS — R21 Rash and other nonspecific skin eruption: Secondary | ICD-10-CM

## 2014-04-30 LAB — CBC WITH DIFFERENTIAL/PLATELET
BASOS PCT: 0.3 % (ref 0.0–3.0)
Basophils Absolute: 0 10*3/uL (ref 0.0–0.1)
EOS PCT: 0.4 % (ref 0.0–5.0)
Eosinophils Absolute: 0 10*3/uL (ref 0.0–0.7)
HCT: 29.7 % — ABNORMAL LOW (ref 36.0–46.0)
HEMOGLOBIN: 9.8 g/dL — AB (ref 12.0–15.0)
Lymphocytes Relative: 28.9 % (ref 12.0–46.0)
Lymphs Abs: 1.2 10*3/uL (ref 0.7–4.0)
MCHC: 32.9 g/dL (ref 30.0–36.0)
MCV: 98.5 fl (ref 78.0–100.0)
MONO ABS: 0.2 10*3/uL (ref 0.1–1.0)
Monocytes Relative: 4.6 % (ref 3.0–12.0)
NEUTROS PCT: 65.8 % (ref 43.0–77.0)
Neutro Abs: 2.7 10*3/uL (ref 1.4–7.7)
Platelets: 163 10*3/uL (ref 150.0–400.0)
RBC: 3.02 Mil/uL — AB (ref 3.87–5.11)
RDW: 14.6 % (ref 11.5–15.5)
WBC: 4.1 10*3/uL (ref 4.0–10.5)

## 2014-04-30 LAB — COMPREHENSIVE METABOLIC PANEL
ALBUMIN: 3.9 g/dL (ref 3.5–5.2)
ALT: 16 U/L (ref 0–35)
AST: 27 U/L (ref 0–37)
Alkaline Phosphatase: 61 U/L (ref 39–117)
BUN: 28 mg/dL — AB (ref 6–23)
CALCIUM: 9.1 mg/dL (ref 8.4–10.5)
CHLORIDE: 106 meq/L (ref 96–112)
CO2: 28 mEq/L (ref 19–32)
Creatinine, Ser: 0.8 mg/dL (ref 0.4–1.2)
GFR: 70.24 mL/min (ref >60.00)
GLUCOSE: 101 mg/dL — AB (ref 70–99)
POTASSIUM: 4.6 meq/L (ref 3.5–5.1)
Sodium: 139 mEq/L (ref 135–145)
Total Bilirubin: 0.8 mg/dL (ref 0.2–1.2)
Total Protein: 6.1 g/dL (ref 6.0–8.3)

## 2014-04-30 LAB — TSH: TSH: 2.49 u[IU]/mL (ref 0.35–4.50)

## 2014-04-30 LAB — VITAMIN B12: Vitamin B-12: 586 pg/mL (ref 211–911)

## 2014-04-30 LAB — T4, FREE: FREE T4: 1.04 ng/dL (ref 0.60–1.60)

## 2014-04-30 LAB — SEDIMENTATION RATE: Sed Rate: 3 mm/hr (ref 0–22)

## 2014-04-30 MED ORDER — FAMOTIDINE 10 MG PO TABS: 10.0000 mg | ORAL_TABLET | Freq: Two times a day (BID) | ORAL | Status: DC

## 2014-04-30 MED ORDER — FLUTICASONE PROPIONATE 50 MCG/ACT NA SUSP: 1.0000 | Freq: Two times a day (BID) | NASAL | Status: DC

## 2014-04-30 NOTE — Assessment & Plan Note (Signed)
R elbow, R hip. Partially responsive to steroid cream. May be T-cell lymphoma. Discussed the possibility w/pt & daughter-they do not want rash biopsied. Continue w/prescribed skin care regimen.

## 2014-04-30 NOTE — Patient Instructions (Addendum)
My office will call with lab results.  Hoarseness may be due to reflux. Let's try acid reducer-pepcid, & see if symptom is relieved. Change sressing every 3 to days. Wash with mild soap like Dove. Pat dry. Apply vaseline & nonadherent dressing. No tape on skin. Let us know if you feel it is not improving or you see signs of infection-growing redness, yellow drainage, pain. Follow up in 1 month. Nice to see you!

## 2014-04-30 NOTE — Assessment & Plan Note (Signed)
C/o several weeks hoarseness. Nose often wet, but does not hve to "blow". No indigestion or globus sensation.  Using saline spray. Will try pepcid. Thyroid studies today. F/u 1 mo.

## 2014-04-30 NOTE — Progress Notes (Signed)
Subjective:     Diane BoucheMary W Santana is a 80 y.o. female presents to f/u on anemia, weight loss, incontinence, rash, & reports recent fall resulting in bruise R elbow & upper arm skin tear. Diane Santana says she has been "pretty well". No new complaints. She does not want hearing aids. Continues to live alone. Daughter lives next door & has lunch with her daily. Diane. Manson Santana did not pass driver's license test this past spring: she candidly states she is "not too upset about it". She acknowledges she did not know all the answers to the questions. Recent fall occurred at home-getting up to go to b-room. She fell in bedroom. No assistance. Injuries as stated above. No rugs in home. Rash partially improved w/steroid cream. Pt does not want further work-up although discussed it may be cutaneous lymphoma.  The following portions of the patient's history were reviewed and updated as appropriate: allergies, current medications, past family history, past medical history, past social history, past surgical history and problem list.  Review of Systems Pertinent items are noted in HPI.    Objective:    BP 118/67  Pulse 79  Temp(Src) 97.6 F (36.4 C) (Temporal)  Ht 5' (1.524 m)  Wt 90 lb (40.824 kg)  BMI 17.58 kg/m2  SpO2 99% BP 118/67  Pulse 79  Temp(Src) 97.6 F (36.4 C) (Temporal)  Ht 5' (1.524 m)  Wt 90 lb (40.824 kg)  BMI 17.58 kg/m2  SpO2 99% General appearance: alert, cooperative, appears stated age, no distress and slowed mentation Head: Normocephalic, without obvious abnormality, atraumatic Eyes: negative findings: lids and lashes normal and conjunctivae and sclerae normal Throat: lips, mucosa, and tongue normal; teeth and gums normal Lungs: clear to auscultation bilaterally Heart: regular rate and rhythm, S1, S2 normal, no murmur, click, rub or gallop Skin: well defined rash R extensor elbow & forearm, R posterior hip. central cleaering. Raised, scaly, pink. 2 cm abrasion R upper arm, small amount  bleeding. clean. no signs infection. small amt. red blood on old bandage   extremities: FROM R elbow. Assessment:     1. Loss of weight - T4, free - TSH - Thyroid peroxidase antibody - CBC with Differential - Comprehensive metabolic panel - Iron and TIBC - Sedimentation rate - Vitamin B12  2. Anemia - CBC with Differential - Comprehensive metabolic panel - Iron and TIBC - Sedimentation rate - Vitamin B12  3. Hoarseness DD: GERD - T4, free - TSH - Thyroid peroxidase antibody - famotidine (PEPCID) 10 MG tablet; Take 1 tablet (10 mg total) by mouth 2 (two) times daily.  Dispense: 60 tablet; Refill: 1  4. Plaque psoriasis DD: cutaneous lymphoma, discoid lupus. sarcoid

## 2014-04-30 NOTE — Progress Notes (Signed)
Pre visit review using our clinic review tool, if applicable. No additional management support is needed unless otherwise documented below in the visit note. 

## 2014-04-30 NOTE — Assessment & Plan Note (Signed)
Hgb 10.4 last visit. Pt report stools appear nml-[place].  Does not feel more tired than usual. CBC diff, iron studies today, b12

## 2014-04-30 NOTE — Assessment & Plan Note (Signed)
Continues to have incontinence. Wears depends. Not adhering to toilet training. Gets up twice to toilet during night.

## 2014-04-30 NOTE — Assessment & Plan Note (Signed)
Down 6 lbs. In 6 mos. Pt feels like she eats, daughter says she doesn't eat much, eats 3 times daily. Drinks 1/2 can ensure. Thyroid nml, anemia last visit. Rash unknown, partially responsive to steroid cream. Tcell lymphoma?

## 2014-05-01 ENCOUNTER — Telehealth: Payer: Self-pay | Admitting: Nurse Practitioner

## 2014-05-01 LAB — IRON AND TIBC
%SAT: 43 % (ref 20–55)
IRON: 103 ug/dL (ref 42–145)
TIBC: 239 ug/dL — ABNORMAL LOW (ref 250–470)
UIBC: 136 ug/dL (ref 125–400)

## 2014-05-01 LAB — THYROID PEROXIDASE ANTIBODY: Thyroperoxidase Ab SerPl-aCnc: <10 IU/mL (ref <35.0)

## 2014-05-01 NOTE — Telephone Encounter (Signed)
Called daughter, advised lab results. Daughter verbalized understanding.

## 2014-05-01 NOTE — Telephone Encounter (Signed)
pls call pt: Advise May need to speak w/daughter. Hemoglobin has dropped more. Iron slightly low. Start multivitamin for seniors with iron. Take at daily at lunch time. All other labs look good-thyroid nml.

## 2014-05-28 ENCOUNTER — Ambulatory Visit: Payer: Medicare Other | Admitting: Nurse Practitioner

## 2015-06-17 ENCOUNTER — Emergency Department (HOSPITAL_COMMUNITY): Payer: Medicare Other

## 2015-06-17 ENCOUNTER — Other Ambulatory Visit: Payer: Self-pay

## 2015-06-17 ENCOUNTER — Encounter (HOSPITAL_COMMUNITY): Payer: Self-pay | Admitting: *Deleted

## 2015-06-17 ENCOUNTER — Emergency Department (HOSPITAL_COMMUNITY)
Admission: EM | Admit: 2015-06-17 | Discharge: 2015-06-17 | Disposition: A | Discharge status: Home / Self Care | Payer: Medicare Other | Attending: Emergency Medicine | Admitting: Emergency Medicine

## 2015-06-17 ENCOUNTER — Ambulatory Visit (INDEPENDENT_AMBULATORY_CARE_PROVIDER_SITE_OTHER): Payer: Medicare Other | Admitting: Family Medicine

## 2015-06-17 VITALS — BP 108/62 | HR 80 | Temp 97.7°F | Resp 16 | Ht 60.0 in | Wt 86.0 lb

## 2015-06-17 DIAGNOSIS — Z7982 Long term (current) use of aspirin: Secondary | ICD-10-CM | POA: Insufficient documentation

## 2015-06-17 DIAGNOSIS — W19XXXA Unspecified fall, initial encounter: Secondary | ICD-10-CM | POA: Diagnosis not present

## 2015-06-17 DIAGNOSIS — F0390 Unspecified dementia without behavioral disturbance: Secondary | ICD-10-CM

## 2015-06-17 DIAGNOSIS — R29810 Facial weakness: Secondary | ICD-10-CM

## 2015-06-17 DIAGNOSIS — Z9181 History of falling: Secondary | ICD-10-CM | POA: Diagnosis not present

## 2015-06-17 DIAGNOSIS — M25551 Pain in right hip: Secondary | ICD-10-CM | POA: Diagnosis not present

## 2015-06-17 HISTORY — DX: Unspecified dementia, unspecified severity, without behavioral disturbance, psychotic disturbance, mood disturbance, and anxiety: F03.90

## 2015-06-17 LAB — COMPREHENSIVE METABOLIC PANEL
ALT: 29 U/L (ref 14–54)
AST: 46 U/L — ABNORMAL HIGH (ref 15–41)
Albumin: 3.5 g/dL (ref 3.5–5.0)
Alkaline Phosphatase: 82 U/L (ref 38–126)
Anion gap: 7 (ref 5–15)
BUN: 31 mg/dL — ABNORMAL HIGH (ref 6–20)
CO2: 28 mmol/L (ref 22–32)
Calcium: 9 mg/dL (ref 8.9–10.3)
Chloride: 107 mmol/L (ref 101–111)
Creatinine, Ser: 1.18 mg/dL — ABNORMAL HIGH (ref 0.44–1.00)
GFR calc Af Amer: 47 mL/min — ABNORMAL LOW (ref >60)
GFR calc non Af Amer: 40 mL/min — ABNORMAL LOW (ref >60)
Glucose, Bld: 115 mg/dL — ABNORMAL HIGH (ref 65–99)
Potassium: 4.5 mmol/L (ref 3.5–5.1)
Sodium: 142 mmol/L (ref 135–145)
Total Bilirubin: 0.6 mg/dL (ref 0.3–1.2)
Total Protein: 6.1 g/dL — ABNORMAL LOW (ref 6.5–8.1)

## 2015-06-17 LAB — CBC
HCT: 33.9 % — ABNORMAL LOW (ref 36.0–46.0)
Hemoglobin: 11.5 g/dL — ABNORMAL LOW (ref 12.0–15.0)
MCH: 32.4 pg (ref 26.0–34.0)
MCHC: 33.9 g/dL (ref 30.0–36.0)
MCV: 95.5 fL (ref 78.0–100.0)
Platelets: 105 10*3/uL — ABNORMAL LOW (ref 150–400)
RBC: 3.55 MIL/uL — ABNORMAL LOW (ref 3.87–5.11)
RDW: 14.2 % (ref 11.5–15.5)
WBC: 4.5 10*3/uL (ref 4.0–10.5)

## 2015-06-17 LAB — URINALYSIS, ROUTINE W REFLEX MICROSCOPIC
Glucose, UA: NEGATIVE mg/dL
KETONES UR: 15 mg/dL — AB
LEUKOCYTES UA: NEGATIVE
NITRITE: NEGATIVE
PH: 5.5 (ref 5.0–8.0)
PROTEIN: 30 mg/dL — AB
SPECIFIC GRAVITY, URINE: 1.03 (ref 1.005–1.030)
Urobilinogen, UA: 1 mg/dL (ref 0.0–1.0)

## 2015-06-17 LAB — URINE MICROSCOPIC-ADD ON

## 2015-06-17 LAB — DIFFERENTIAL
Basophils Absolute: 0 10*3/uL (ref 0.0–0.1)
Basophils Relative: 0 % (ref 0–1)
Eosinophils Absolute: 0 10*3/uL (ref 0.0–0.7)
Eosinophils Relative: 0 % (ref 0–5)
Lymphocytes Relative: 24 % (ref 12–46)
Lymphs Abs: 1.1 10*3/uL (ref 0.7–4.0)
Monocytes Absolute: 0.3 10*3/uL (ref 0.1–1.0)
Monocytes Relative: 6 % (ref 3–12)
Neutro Abs: 3.1 10*3/uL (ref 1.7–7.7)
Neutrophils Relative %: 70 % (ref 43–77)

## 2015-06-17 LAB — PROTIME-INR
INR: 1.09 (ref 0.00–1.49)
Prothrombin Time: 14.3 seconds (ref 11.6–15.2)

## 2015-06-17 LAB — APTT: aPTT: 27 seconds (ref 24–37)

## 2015-06-17 LAB — TROPONIN I: Troponin I: <0.03 ng/mL (ref <0.031)

## 2015-06-17 NOTE — ED Notes (Signed)
Pt off unit with CT 

## 2015-06-17 NOTE — Patient Instructions (Signed)
Take your mother to the emergency room, so that she can be fully evaluated for her fall, hitting her head and facial droop. I will need to see her by Monday if she is discharged from the emergency room, if she is admitted I would like to see her within 2 days after she is discharged. She will need 24 hour home assistance, and if she is not admitted then we will help you arrange this in the outpatient setting. If she is admitted please make sure the inpatient team is aware of her living alone in the home.

## 2015-06-17 NOTE — ED Provider Notes (Signed)
CSN: [id number]     Arrival date & time 06/17/15  1457 History   First MD Initiated Contact with Patient 06/17/15 1652     Chief Complaint  Patient presents with  . Fall  . Facial Droop     (Consider location/radiation/quality/duration/timing/severity/associated sxs/prior Treatment) The history is provided by the patient. No language interpreter was used.   this is an 81 year old female with past medical history of dementia who presents today with concern for left-sided facial droop. Patient notably has had falls in the last 3-4 days at home. Patient lives at home alone and family because not to check on her several times daily he states that at one point down day prior the patient was on the floor. They state That Immediately after That Point They Did Notice that the patient seems slightly more confused but is now back to baseline. Patient was seen by primary today and they noted that the patient had what appeared to be left facial droop. The sent her to this department for further evaluation. On arrival sinus accompanying the patient and he states that he feels like the facial droop has resolved. Patient is not on any anticoagulants. Patient is not more confused than her baseline from dementia. They deny any recent fevers chills nausea vomiting diarrhea. Patient denies any chest pain or shortness of breath.  Past Medical History  Diagnosis Date  . Acute meniscal tear of knee     left knee  . Urgency of urination   . Nocturia   . Dementia    Past Surgical History  Procedure Laterality Date  . Cataract extraction w/ intraocular lens  implant, bilateral    . Dilation and curettage of uterus  2004 (APPROX)  . Knee arthroscopy with lateral menisectomy Left 03/04/2013    Procedure: LEFT KNEE ARTHROSCOPY PARTIAL MEDIAL AND LATERAL   MENISCECTOMY;  Surgeon: Drucilla Schmidt, MD;  Location: [facility];  Service: Orthopedics;  Laterality: Left;   Family History  Problem Relation  Age of Onset  . Cancer Mother     Breast   History  Substance Use Topics  . Smoking status: Never Smoker   . Smokeless tobacco: Never Used  . Alcohol Use: No   OB History    No data available     Review of Systems  Constitutional: Negative for fever and chills.  HENT: Negative for congestion and facial swelling.   Eyes: Negative for photophobia and visual disturbance.  Respiratory: Negative for shortness of breath and wheezing.   Cardiovascular: Negative for chest pain.  Gastrointestinal: Negative for nausea, vomiting, abdominal pain and diarrhea.  Skin: Negative for rash and wound.  Neurological: Positive for facial asymmetry (noted by PCP, son states now resolved). Negative for speech difficulty and headaches.  Psychiatric/Behavioral: Positive for confusion (at baseline).  All other systems reviewed and are negative.     Allergies  Review of patient's allergies indicates no known allergies.  Home Medications   Prior to Admission medications   Medication Sig Start Date End Date Taking? Authorizing Provider  acetaminophen (TYLENOL) 500 MG tablet Take 500 mg by mouth every 6 (six) hours as needed for mild pain.   Yes Historical Provider, MD  aspirin 81 MG tablet Take 81 mg by mouth daily.   Yes Historical Provider, MD  famotidine (PEPCID) 10 MG tablet Take 1 tablet (10 mg total) by mouth 2 (two) times daily. Patient not taking: Reported on 06/17/2015 04/30/14   Kelle Darting, NP  triamcinolone ointment (  KENALOG) 0.5 % Apply 1 application topically 2 (two) times daily. Patient not taking: Reported on 06/17/2015 10/30/13   Kelle Darting, NP   BP 138/64 mmHg  Pulse 82  Temp(Src) 98.7 F (37.1 C) (Oral)  Resp 15  SpO2 99% Physical Exam  Constitutional: She is oriented to person, place, and time. No distress.  Frail-appearing  HENT:  Head: Normocephalic and atraumatic.  Eyes: Conjunctivae and EOM are normal.  Neck: Normal range of motion. Neck supple.  Cardiovascular:  Normal rate, regular rhythm and normal heart sounds.   No murmur heard. Pulmonary/Chest: Effort normal and breath sounds normal.  Abdominal: Soft. Bowel sounds are normal. There is no tenderness. There is no rebound and no guarding.  Musculoskeletal: Normal range of motion. She exhibits no tenderness.  Neurological: She is alert and oriented to person, place, and time. She has normal strength. No cranial nerve deficit (CN II-XII intact) or sensory deficit (intact throughout). GCS eye subscore is 4. GCS verbal subscore is 5. GCS motor subscore is 6.  Skin: Skin is warm and dry. She is not diaphoretic.  Psychiatric: Her behavior is normal.  Nursing note and vitals reviewed.   ED Course  Procedures (including critical care time) Labs Review Labs Reviewed  CBC - Abnormal; Notable for the following:    RBC 3.55 (*)    Hemoglobin 11.5 (*)    HCT 33.9 (*)    Platelets 105 (*)    All other components within normal limits  COMPREHENSIVE METABOLIC PANEL - Abnormal; Notable for the following:    Glucose, Bld 115 (*)    BUN 31 (*)    Creatinine, Ser 1.18 (*)    Total Protein 6.1 (*)    AST 46 (*)    GFR calc non Af Amer 40 (*)    GFR calc Af Amer 47 (*)    All other components within normal limits  URINALYSIS, ROUTINE W REFLEX MICROSCOPIC (NOT AT [facility]) - Abnormal; Notable for the following:    Color, Urine AMBER (*)    Hgb urine dipstick SMALL (*)    Bilirubin Urine SMALL (*)    Ketones, ur 15 (*)    Protein, ur 30 (*)    All other components within normal limits  URINE MICROSCOPIC-ADD ON - Abnormal; Notable for the following:    Squamous Epithelial / LPF FEW (*)    Casts HYALINE CASTS (*)    All other components within normal limits  URINE CULTURE  PROTIME-INR  APTT  DIFFERENTIAL  TROPONIN I    Imaging Review Ct Head Wo Contrast  06/17/2015   CLINICAL DATA:  Fall 2 days ago  EXAM: CT HEAD WITHOUT CONTRAST  TECHNIQUE: Contiguous axial images were obtained from the base of the  skull through the vertex without intravenous contrast.  COMPARISON:  None.  FINDINGS: Mild cortical volume loss noted with proportional ventricular prominence. Areas of periventricular white matter hypodensity are most compatible with small vessel ischemic change. No acute hemorrhage, infarct, or mass lesion is identified.  IMPRESSION: Chronic findings as above, no acute intracranial abnormality.   Electronically Signed   By: Christiana Pellant M.D.   On: 06/17/2015 17:52     EKG Interpretation None      MDM   Final diagnoses:  History of recent fall  Dementia, without behavioral disturbance    Patient is an 81 year old female with past medical history as noted above and presents today with left-sided facial droop after several falls sustained last 1 week. On  exam patient is awake and alert and in no acute distress. She obeys patient commands and appears to be neurologically intact. No evidence of cranial nerve deficits on exam. Did obtain a CT had given the patient's unwitnessed falls recently. No acute intracranial abnormalities were noted on the CT head. This coupled with patient's lack of new neurological deficits is reassuring. We'll discharge the patient home with the son he will stay with the patient for round-the-clock care. Patient will follow-up with primary doctor within the next few days to discuss getting a round-the-clock care from now on as she is at risk for injuries if she were alone. Discussed this plan with the son who is in agreement. Patient was discharged home with the son in good condition.    Madolyn Frieze, MD 06/19/15 1607  Raeford Razor, MD 06/20/15 [phone]

## 2015-06-17 NOTE — Discharge Instructions (Signed)
Diane Santana will need 24 hour care once she is at home. Please plan to get an appointment with primary doctor in the next day or so to discuss long-term care.      Fall Prevention and Home Safety Falls cause injuries and can affect all age groups. It is possible to prevent falls.  HOW TO PREVENT FALLS  Wear shoes with rubber soles that do not have an opening for your toes.  Keep the inside and outside of your house well lit.  Use night lights throughout your home.  Remove clutter from floors.  Clean up floor spills.  Remove throw rugs or fasten them to the floor with carpet tape.  Do not place electrical cords across pathways.  Put grab bars by your tub, shower, and toilet. Do not use towel bars as grab bars.  Put handrails on both sides of the stairway. Fix loose handrails.  Do not climb on stools or stepladders, if possible.  Do not wax your floors.  Repair uneven or unsafe sidewalks, walkways, or stairs.  Keep items you use a lot within reach.  Be aware of pets.  Keep emergency numbers next to the telephone.  Put smoke detectors in your home and near bedrooms. Ask your doctor what other things you can do to prevent falls. Document Released: 08/26/2009 Document Revised: 04/30/2012 Document Reviewed: 01/30/2012 [facility] Patient Information 2015 [place], [place]. This information is not intended to replace advice given to you by your health care provider. Make sure you discuss any questions you have with your health care provider.

## 2015-06-17 NOTE — Progress Notes (Signed)
Subjective:    Patient ID: Diane Santana, female    DOB: [birth year], 81 y.o.   MRN: [id number]  HPI  Fall: Patient presents to acute office visit today with her daughter after a fall 2 days ago. Patient's exam and history is severely diminished by dementia. Patient's fall was unwitnessed, she does live alone. Her daughter is with her today and states either herself or her brother spends most of the daytime with her mother or at least checks on her. Patient is unable to initially say where she fell, saying that it was out in the yard, and then her daughter had to correct her and say that she had told her it was in the kitchen. Patient complains of pain mostly in her left hip today but states that she did have a headache after her fall. Her daughter dresses her daily, and states she did not see any obvious bruising lacerations except for on her left wrist area. Her daughter gave her Tylenol, she states her mother was saying her back was hurting her. In the office today patient states that nothing is hurting her particularly, but she continues to hold left hip. Patient daughter feels her mother has come increasingly more demented over the last 1-2 months. Patient's daughter feels that there is a new sided left facial droop and drooling since her fall 2 days ago. Her daughter feels the patient definitely hit her head during the fall because she had some swelling in her face next morning after the fall. She is on no medications. They deny any past medical history that would be contributory, with the exception of dementia. Patient no longer takes a daily aspirin, because she refuses. Of note: Patient is unable to perform ADLs without full assistance. Patient has a walker at home, but does not use it. Patient is unable to use the telephone in the event of an emergency, because she no longer understands how to dial. She has urinary and fecal incontinence. She has had multiple falls within the last few months, per  patient. She has had loss of weight unintentionally. She depends on her family to cook and feed.  Never smoker Past Medical History  Diagnosis Date  . Acute meniscal tear of knee     left knee  . Urgency of urination   . Nocturia   . Dementia    No Known Allergies Past Surgical History  Procedure Laterality Date  . Cataract extraction w/ intraocular lens  implant, bilateral    . Dilation and curettage of uterus  2004 (APPROX)  . Knee arthroscopy with lateral menisectomy Left 03/04/2013    Procedure: LEFT KNEE ARTHROSCOPY PARTIAL MEDIAL AND LATERAL   MENISCECTOMY;  Surgeon: Drucilla Schmidt, MD;  Location: [facility];  Service: Orthopedics;  Laterality: Left;   Family History  Problem Relation Age of Onset  . Cancer Mother     Breast    Review of Systems  Constitutional: Positive for appetite change and unexpected weight change. Negative for fever and activity change.  HENT: Positive for drooling and facial swelling.   Eyes: Negative.   Respiratory: Negative.   Cardiovascular: Negative.   Gastrointestinal: Negative.   Genitourinary: Negative for pelvic pain.  Musculoskeletal: Positive for back pain, joint swelling, arthralgias and gait problem. Negative for neck pain and neck stiffness.  Skin: Negative for wound.  Neurological: Positive for syncope, facial asymmetry, speech difficulty, weakness and headaches. Negative for numbness.  Hematological: Bruises/bleeds easily.  Psychiatric/Behavioral: Positive for confusion.  Objective:   Physical Exam BP 108/62 mmHg  Pulse 80  Temp(Src) 97.7 F (36.5 C) (Temporal)  Resp 16  Ht 5' (1.524 m)  Wt 86 lb (39.009 kg)  BMI 16.80 kg/m2  SpO2 99% Gen: Thin, Caucasian female, moderate to severely demented, needs assistance with walking HEENT: [place]. Dentures present. No bruising noted, no discoloration, no lacerations, no tenderness to palpation.  Bilateral eyes without injections or icterus. MMM.  midline  septum.  CV: RRR  Chest: CTAB, no wheeze or crackles Abd: Soft. Flat. NTND. BS present. No Masses palpated.  MSK: Bony tenderness through neck, back, hips, arms, legs, knees or feet. Mild soft tissue swelling right lateral midfoot. Mild swelling, question effusion right knee. Skin: Bruising 2 left wrist and right lateral midfoot. Neuro: Shuffle gait. Short stride. Left upper extremity hand tremor present. Perla. Patient cannot follow finger to test EOM. Patient unable to open her eyes wide, unable to smile. Left-sided mouth droop, and eye droop present. Neuro exam severely diminished secondary to patient not able to follow commands. Unable to complete pronator drift, heel-to-shin, or Romberg, cranial nerves. Patient unsteady on her feet and attempt to getting on exam table and needed more than full assistance. Psych: Confused. Not oriented. Inattentive. Slow moving. Slow thought process.    Assessment & Plan:  [name] is a 81 y.o.  demented female present for acute office visit after unwitnessed fall 2 days ago.  - Given age, unwitnessed fall, severe dementia, new left-sided facial droop and family states patient likely hit her head since there was swelling yesterday I advised patient and daughter to go to the emergency room to have workup. Patient likely needs imaging of her head to rule out stroke/bleed and possibly left hip x-ray. - Patient is dementia is pretty severe, this is the first time I have evaluated the patient, but I feel her dementia FAST score is at least 6e/7a and is in need of full assistance/24 hours in the home. Patient currently lives alone with family checking on her daily. Patient has had multiple falls over the last few months.  - Discuss advanced dementia with her daughter today, and that I believe patient needs 24-hour assistance in the home, or placement. This seems to be a decision the family is going to have to make together. I had recommended that she started  discussions as soon as possible.  - Patient/daughter was encouraged to go to the emergency room immediately to be fully evaluated. They voiced understanding. We discussed that if patient is admitted, and I will want to see her 1-2 days after discharge. If she is not admitted I would like to see her on Monday.

## 2015-06-17 NOTE — ED Notes (Signed)
Pt was sent here by her physician for falls on Monday and Tuesday.  Family took her to the doctor today and he noticed a L facial droop.  Pt has dementia and lives by herself, so it is difficult to obtain accurate hx.

## 2015-06-17 NOTE — Progress Notes (Signed)
Pre visit review using our clinic review tool, if applicable. No additional management support is needed unless otherwise documented below in the visit note. 

## 2015-06-19 LAB — URINE CULTURE

## 2015-06-21 ENCOUNTER — Encounter: Payer: Self-pay | Admitting: Family Medicine

## 2015-06-21 ENCOUNTER — Ambulatory Visit (INDEPENDENT_AMBULATORY_CARE_PROVIDER_SITE_OTHER): Payer: Medicare Other | Admitting: Family Medicine

## 2015-06-21 VITALS — BP 119/63 | HR 76 | Temp 97.8°F | Resp 16 | Ht 60.0 in | Wt 89.0 lb

## 2015-06-21 DIAGNOSIS — Z9181 History of falling: Secondary | ICD-10-CM | POA: Insufficient documentation

## 2015-06-21 DIAGNOSIS — F0390 Unspecified dementia without behavioral disturbance: Secondary | ICD-10-CM

## 2015-06-21 DIAGNOSIS — F03C0 Unspecified dementia, severe, without behavioral disturbance, psychotic disturbance, mood disturbance, and anxiety: Secondary | ICD-10-CM

## 2015-06-21 NOTE — Assessment & Plan Note (Signed)
Diane Santana is  81 y.o. female presented with her daughter [name] today for follow-up after acute fall, increased fall risks, and dementia. - Fall risk: Patient likely at increased risk for falls, this is the second known fall within 4 months. Patient had lived alone, after speaking with daughter today they are having a person stay with her at least 24 hours whether it be themselves or a friend who can stay overnight. Discussed having fall risk evaluation, for recommendations on any type of assisted walking devices and possible therapy if needed for strengthening. This order was placed today.   - Dementia: Patient with an MMSE today of 10. Discussed with [name] the progression of dementia, and stages. Discussed it is important for us to watch her weight, adding supplementation when necessary. Patient currently is taking in ensure supplement, but does not like the flavor. Discussed senior resources and PACE program with [name] today. Information was given to her to contact these resources. He will have a family friend stay overnight, so the patient is not home alone. Home health referral for evaluation of falls, home risks etc. Fall risk assessment has been ordered through physical therapy.  Follow-up in 4 months, or sooner if patient is continuing to lose weight, experiences behavior problems or dementia is felt to worsen acutely. 

## 2015-06-21 NOTE — Assessment & Plan Note (Signed)
Diane Santana is  81 y.o. female presented with [name] today for follow-up after acute fall, increased fall risks, and dementia. - Fall risk: Patient likely at increased risk for falls, this is the second known fall within 4 months. Patient had lived alone, after speaking with daughter today they are having a person stay with her at least 24 hours whether it be themselves or a friend who can stay overnight. Discussed having fall risk evaluation, for recommendations on any type of assisted walking devices and possible therapy if needed for strengthening. This order was placed today.   - Dementia: Patient with an MMSE today of 10. Discussed with Okey Regal the progression of dementia, and stages. Discussed it is important for [place] to watch her weight, adding supplementation when necessary. Patient currently is taking in ensure supplement, but does not like the flavor. Discussed senior resources and PACE program with Okey Regal today. Information was given to her to contact these resources. He will have a family friend stay overnight, so the patient is not home alone. Home health referral for evaluation of falls, home risks etc. Fall risk assessment has been ordered through physical therapy.  Follow-up in 4 months, or sooner if patient is continuing to lose weight, experiences behavior problems or dementia is felt to worsen acutely.

## 2015-06-21 NOTE — Patient Instructions (Addendum)
Tax adviser  Address :Diane Santana : [place] Zip : [place] Website : [email or web address] Contact Email : [email or web address] Office Phone : [phone] Information Phone : [phone] Special Notes : [facility]- [phone] [place]- [phone]  Senior Resources of [place] can provide information on local resources including:   Information and Referral (Senior InfoLine) providing seniors, family members, caregivers and others access to information and assistance for the elderly.  Home Delivered Meals (Meals on Wheels)  [place] for those ages 21 and over are offered daytime supervised programs targeted to meet their social, educational, physical and recreational interests. A lunch is also served to those who meet the eligibility criteria.  Caregivers - Volunteer-based program designed to provide seniors with 1-3 hours of service per week. Services include friendly visiting, assistance with grocery shopping, errands, general transportation, etc.  Medical Transportation: Transportation to medical appointments for seniors ages 37 and over who do not receive Medicaid. Funds are occasionally available to transport disabled individuals under 60 who do not receive Medicaid.  [place] (S.H[place]I.I.P.): Volunteers are trained by the [place] S.H.I.I.P. office, to provide free information, counseling and education to [employer] beneficiaries and their family members about: Medicare, Medicare Supplements, [place], and [place] Prescription Drug Plans.  Nutritional Supplement Program: Ensure, Ensure Plus, and Glucerna products are sold at reduced prices. Simply fill out an application.     The [facility] The [facility] is located in the [place] in downtown  [place]. The Center is a place where senior adults age 12 and over can get together for recreation, participate in field trips, enjoy cultural presentations, engage in volunteer activities and may enjoy a nutritious noon time meal. Individuals age 40-59 may participate in some activities, for a fee.  The [place] is open Monday - Friday. Afternoon activities include: computer training, painting, knitting classes and exercise programs, including Tai Chi. The [facility] is certified by the [employer] of Aging as a Ecologist.  [place] Office: [phone] [place]: [phone]  [facility] has a [place], [place] adult day center that offers care Monday through Friday from 7:30 am to 5:30 pm. Participants can choose from one to five days per week on a scheduled basis. Our center provides a safe and secure setting for frail and impaired adults. Participants engage in on-site activities such as art and music. On-site nursing services with a registered nurse or a licensed practical nurse are available. Participants are provided with a nutritious breakfast, lunch and snack. The center is very much like a family unit.  Located at [address] [place],  [place].  Telephone: [phone]     Both the Living Will and [place] require a Insurance account manager to authenticate your signature on either document. You may complete just one or both or neither of the documents stating your Advanced directives.          It was a pleasure seeing you again. We will set up home health evaluation in the home, and a falls risk assessment to be completed. This will help provide more safety in the home, and evaluate her need for assistive walking devices and falls risk. Please monitor her weight weekly, if she continues to lose weight as needed evaluate her more often. You can also  increase  the amount of ensure she drinks daily. I have included some resources above for senior resources in the area, this may be different for your county. I am glad that you're getting 24-hour assistance in the home.

## 2015-06-21 NOTE — Progress Notes (Signed)
Subjective:    Patient ID: Diane Santana, female    DOB: [birth year], 81 y.o.   MRN: [id number]  HPI  Fall risk: Patient presents office visit date follow-up after a fall. Patient was seen in the office, and had new left-sided facial droop as well as disorientation. She was sent to the emergency room for evaluation of stroke versus head trauma from fall. Fall had been unwitnessed, patient lives alone. CT of the head resulted with chronic findings, no acute intracranial abnormality. Patient had mild cortical volume loss noted with proportional ventricular prominence. Areas of periventricular white matter hypodensity, which are compatible with small vessel ischemic changes. No acute hemorrhage, infarct or mass. She presents to office visit again today with [name]. Okey Regal states that she noticed over the weekend that her mother came much more alert, and did not complain of pain. The fall was unwitnessed, but seems to might have occurred in the kitchen where she hit her head on the stove. Patient had a mild slowly shuffled gait. Has imbalance with leaning forward. Okey Regal states that her and her brother have discussed increasing help in the home, and they now have a caretaker/friend that is going to be spending the nights with her mother when they are unable to spend the night with her. She's had at least 2 falls that are known in the last 4 months. Patient has a walker in the home, but does not use it.  Dementia: On assessment of the last visit patient appeared to be approximately dementia fast stage 6E. she is dependent on family for all ADLs, and most all IDLs. She endorses fecal and urinary incontinence, and wears an adult diaper. She has difficulty standing from her bed, and needs full assistance to get out of bed. She eats all of her meals with her family, but family does endorse 4 pound weight loss. Patient has been tried on  Ensure supplements, but does not like the taste of them. Patient is  unable to understand how to telephone, despite having a land line, in the home. Prior to our last office visit, patient was spending nights alone. Okey Regal states that her and her brother have discussed increasing help in the home, and they now have a caretaker/friend that is going to be spending the nights with her mother when they are unable to spend the night with her. The home does have basement stairs. Patient is not known to wander. There has been some verbal behavior changes very minimally, but no physical behavior changes, no outbursts. Patient has had cataract extractions. She has had a hearing exam which showed a need for hearing aid, but she refuses to wear her hearing aid.  Never smoker Past Medical History  Diagnosis Date  . Acute meniscal tear of knee     left knee  . Urgency of urination   . Nocturia   . Dementia    No Known Allergies Past Surgical History  Procedure Laterality Date  . Cataract extraction w/ intraocular lens  implant, bilateral    . Dilation and curettage of uterus  2004 (APPROX)  . Knee arthroscopy with lateral menisectomy Left 03/04/2013    Procedure: LEFT KNEE ARTHROSCOPY PARTIAL MEDIAL AND LATERAL   MENISCECTOMY;  Surgeon: Drucilla Schmidt, MD;  Location: [facility];  Service: Orthopedics;  Laterality: Left;   Family History  Problem Relation Age of Onset  . Cancer Mother     Breast   History   Social History  .  Marital Status: Married    Spouse Name: N/A  . Number of Children: 2  . Years of Education: N/A   Occupational History  . retired    Social History Main Topics  . Smoking status: Never Smoker   . Smokeless tobacco: Never Used  . Alcohol Use: No  . Drug Use: No  . Sexual Activity: Not on file   Other Topics Concern  . Not on file   Social History Narrative   Ms. Lachman lives alone. Her daughter lives 5 minutes away. Her son lives in [place] & comes to visit monthly.    Review of Systems  Constitutional: Positive  for unexpected weight change. Negative for activity change, appetite change and fatigue.  Gastrointestinal: Negative.   Musculoskeletal: Negative for myalgias, back pain, joint swelling and arthralgias.  Neurological: Negative for dizziness, tremors, facial asymmetry, speech difficulty, light-headedness, numbness and headaches.  Psychiatric/Behavioral: Negative for hallucinations and behavioral problems.      Objective:   Physical Exam BP 119/63 mmHg  Pulse 76  Temp(Src) 97.8 F (36.6 C) (Temporal)  Resp 16  Ht 5' (1.524 m)  Wt 89 lb (40.37 kg)  BMI 17.38 kg/m2  SpO2 100% Gen: NAD. Nontoxic in appearance, thin Caucasian female, moderate to severe dementia. Mini-Mental Status: 10 HEENT: AT. [place].Bilateral eyes without injections or icterus. MMM.  Neuro: Short stride, shuffled gait.Marland Kitchen PERLA. EOMi. Alert. Not oriented to place or time. Cranial nerves II through XII intact, no focal deficits. Normal smile, no facial droop today. Muscle strength equal bilaterally.     Assessment & Plan:  Diane Santana is  81 y.o. female presented with [name] today for follow-up after acute fall, increased fall risks, and dementia. - Fall risk: Patient likely at increased risk for falls, this is the second known fall within 4 months. Patient had lived alone, after speaking with daughter today they are having a person stay with her at least 24 hours whether it be themselves or a friend who can stay overnight. Discussed having fall risk evaluation, for recommendations on any type of assisted walking devices and possible therapy if needed for strengthening. This order was placed today.   - Dementia: Patient with an MMSE today of 10. Discussed with Okey Regal the progression of dementia, and stages. Discussed it is important for [place] to watch her weight, adding supplementation when necessary. Patient currently is taking in ensure supplement, but does not like the flavor. Discussed senior resources and PACE program  with Okey Regal today. Information was given to her to contact these resources. He will have a family friend stay overnight, so the patient is not home alone. Home health referral for evaluation of falls, home risks etc. Fall risk assessment has been ordered through physical therapy.  Follow-up in 4 months, or sooner if patient is continuing to lose weight, experiences behavior problems or dementia is felt to worsen acutely.  25 minutes or more of face to face counseling with the patient.

## 2015-06-21 NOTE — Progress Notes (Signed)
Pre visit review using our clinic review tool, if applicable. No additional management support is needed unless otherwise documented below in the visit note. 

## 2015-07-06 ENCOUNTER — Encounter: Payer: Self-pay | Admitting: Family Medicine

## 2015-07-08 ENCOUNTER — Telehealth: Payer: Self-pay | Admitting: Family Medicine

## 2015-07-08 ENCOUNTER — Encounter: Payer: Self-pay | Admitting: Family Medicine

## 2015-07-08 ENCOUNTER — Other Ambulatory Visit: Payer: Self-pay | Admitting: Family Medicine

## 2015-07-08 DIAGNOSIS — R159 Full incontinence of feces: Secondary | ICD-10-CM | POA: Insufficient documentation

## 2015-07-08 MED ORDER — 3-IN-1 BEDSIDE TOILET MISC: Status: AC

## 2015-07-08 NOTE — Telephone Encounter (Signed)
Patients daughter aware

## 2015-07-08 NOTE — Telephone Encounter (Signed)
Please call patient's daughter Shiniqua. Her requests for a 3 and 1 toilet DME has been printed and placed out from for her to pick up. She will need to take this to medical supply store. I'm glad physical therapy is going well for her mother. Thanks.

## 2015-07-26 DIAGNOSIS — F0390 Unspecified dementia without behavioral disturbance: Secondary | ICD-10-CM | POA: Diagnosis not present

## 2015-10-07 ENCOUNTER — Inpatient Hospital Stay (HOSPITAL_COMMUNITY)
Admission: EM | Admit: 2015-10-07 | Discharge: 2015-10-11 | DRG: 469 | Disposition: A | Discharge status: Skilled Nursing Facility | Payer: Medicare Other | Attending: Internal Medicine | Admitting: Internal Medicine

## 2015-10-07 ENCOUNTER — Emergency Department (HOSPITAL_COMMUNITY): Payer: Medicare Other

## 2015-10-07 ENCOUNTER — Inpatient Hospital Stay (HOSPITAL_COMMUNITY): Payer: Medicare Other

## 2015-10-07 ENCOUNTER — Encounter (HOSPITAL_COMMUNITY): Payer: Self-pay | Admitting: Cardiology

## 2015-10-07 DIAGNOSIS — F03C0 Unspecified dementia, severe, without behavioral disturbance, psychotic disturbance, mood disturbance, and anxiety: Secondary | ICD-10-CM | POA: Diagnosis present

## 2015-10-07 DIAGNOSIS — E042 Nontoxic multinodular goiter: Secondary | ICD-10-CM | POA: Diagnosis present

## 2015-10-07 DIAGNOSIS — Z66 Do not resuscitate: Secondary | ICD-10-CM | POA: Diagnosis present

## 2015-10-07 DIAGNOSIS — S0181XD Laceration without foreign body of other part of head, subsequent encounter: Secondary | ICD-10-CM | POA: Diagnosis not present

## 2015-10-07 DIAGNOSIS — Y93E1 Activity, personal bathing and showering: Secondary | ICD-10-CM | POA: Diagnosis not present

## 2015-10-07 DIAGNOSIS — S72009A Fracture of unspecified part of neck of unspecified femur, initial encounter for closed fracture: Secondary | ICD-10-CM | POA: Diagnosis present

## 2015-10-07 DIAGNOSIS — S72012A Unspecified intracapsular fracture of left femur, initial encounter for closed fracture: Principal | ICD-10-CM | POA: Diagnosis present

## 2015-10-07 DIAGNOSIS — E43 Unspecified severe protein-calorie malnutrition: Secondary | ICD-10-CM | POA: Diagnosis present

## 2015-10-07 DIAGNOSIS — I5022 Chronic systolic (congestive) heart failure: Secondary | ICD-10-CM | POA: Diagnosis present

## 2015-10-07 DIAGNOSIS — S0181XA Laceration without foreign body of other part of head, initial encounter: Secondary | ICD-10-CM | POA: Diagnosis present

## 2015-10-07 DIAGNOSIS — M25552 Pain in left hip: Secondary | ICD-10-CM | POA: Diagnosis present

## 2015-10-07 DIAGNOSIS — Z01810 Encounter for preprocedural cardiovascular examination: Secondary | ICD-10-CM | POA: Diagnosis not present

## 2015-10-07 DIAGNOSIS — Z23 Encounter for immunization: Secondary | ICD-10-CM

## 2015-10-07 DIAGNOSIS — F05 Delirium due to known physiological condition: Secondary | ICD-10-CM | POA: Diagnosis not present

## 2015-10-07 DIAGNOSIS — W19XXXA Unspecified fall, initial encounter: Secondary | ICD-10-CM | POA: Diagnosis not present

## 2015-10-07 DIAGNOSIS — F0390 Unspecified dementia without behavioral disturbance: Secondary | ICD-10-CM | POA: Diagnosis not present

## 2015-10-07 DIAGNOSIS — S72002A Fracture of unspecified part of neck of left femur, initial encounter for closed fracture: Secondary | ICD-10-CM | POA: Diagnosis present

## 2015-10-07 DIAGNOSIS — Z681 Body mass index (BMI) 19 or less, adult: Secondary | ICD-10-CM | POA: Diagnosis not present

## 2015-10-07 DIAGNOSIS — R059 Cough, unspecified: Secondary | ICD-10-CM

## 2015-10-07 DIAGNOSIS — S72142A Displaced intertrochanteric fracture of left femur, initial encounter for closed fracture: Secondary | ICD-10-CM

## 2015-10-07 DIAGNOSIS — T40605A Adverse effect of unspecified narcotics, initial encounter: Secondary | ICD-10-CM | POA: Diagnosis not present

## 2015-10-07 DIAGNOSIS — R32 Unspecified urinary incontinence: Secondary | ICD-10-CM | POA: Diagnosis present

## 2015-10-07 DIAGNOSIS — Z419 Encounter for procedure for purposes other than remedying health state, unspecified: Secondary | ICD-10-CM

## 2015-10-07 DIAGNOSIS — Z01818 Encounter for other preprocedural examination: Secondary | ICD-10-CM | POA: Diagnosis not present

## 2015-10-07 DIAGNOSIS — D62 Acute posthemorrhagic anemia: Secondary | ICD-10-CM | POA: Diagnosis not present

## 2015-10-07 DIAGNOSIS — D696 Thrombocytopenia, unspecified: Secondary | ICD-10-CM | POA: Diagnosis present

## 2015-10-07 DIAGNOSIS — R52 Pain, unspecified: Secondary | ICD-10-CM

## 2015-10-07 DIAGNOSIS — I447 Left bundle-branch block, unspecified: Secondary | ICD-10-CM | POA: Diagnosis not present

## 2015-10-07 DIAGNOSIS — R05 Cough: Secondary | ICD-10-CM

## 2015-10-07 LAB — CBC
HEMATOCRIT: 33.5 % — AB (ref 36.0–46.0)
Hemoglobin: 11.5 g/dL — ABNORMAL LOW (ref 12.0–15.0)
MCH: 33 pg (ref 26.0–34.0)
MCHC: 34.3 g/dL (ref 30.0–36.0)
MCV: 96.3 fL (ref 78.0–100.0)
Platelets: 124 10*3/uL — ABNORMAL LOW (ref 150–400)
RBC: 3.48 MIL/uL — AB (ref 3.87–5.11)
RDW: 13.8 % (ref 11.5–15.5)
WBC: 10.2 10*3/uL (ref 4.0–10.5)

## 2015-10-07 LAB — URINALYSIS, ROUTINE W REFLEX MICROSCOPIC
Bilirubin Urine: NEGATIVE
GLUCOSE, UA: NEGATIVE mg/dL
Ketones, ur: 15 mg/dL — AB
Leukocytes, UA: NEGATIVE
Nitrite: NEGATIVE
PH: 7.5 (ref 5.0–8.0)
PROTEIN: NEGATIVE mg/dL
Specific Gravity, Urine: 1.021 (ref 1.005–1.030)

## 2015-10-07 LAB — URINE MICROSCOPIC-ADD ON

## 2015-10-07 LAB — TROPONIN I
Troponin I: <0.03 ng/mL (ref <0.031)
Troponin I: <0.03 ng/mL (ref <0.031)

## 2015-10-07 LAB — BASIC METABOLIC PANEL
Anion gap: 6 (ref 5–15)
BUN: 21 mg/dL — ABNORMAL HIGH (ref 6–20)
CHLORIDE: 106 mmol/L (ref 101–111)
CO2: 28 mmol/L (ref 22–32)
Calcium: 9.3 mg/dL (ref 8.9–10.3)
Creatinine, Ser: 0.85 mg/dL (ref 0.44–1.00)
GFR calc non Af Amer: 60 mL/min — ABNORMAL LOW (ref >60)
Glucose, Bld: 99 mg/dL (ref 65–99)
POTASSIUM: 4.8 mmol/L (ref 3.5–5.1)
SODIUM: 140 mmol/L (ref 135–145)

## 2015-10-07 LAB — ABO/RH: ABO/RH(D): O POS

## 2015-10-07 LAB — TSH: TSH: 3.634 u[IU]/mL (ref 0.350–4.500)

## 2015-10-07 LAB — PROTIME-INR
INR: 1.09 (ref 0.00–1.49)
PROTHROMBIN TIME: 14.3 s (ref 11.6–15.2)

## 2015-10-07 LAB — SURGICAL PCR SCREEN
MRSA, PCR: NEGATIVE
Staphylococcus aureus: NEGATIVE

## 2015-10-07 LAB — T4, FREE: Free T4: 1.21 ng/dL — ABNORMAL HIGH (ref 0.61–1.12)

## 2015-10-07 LAB — I-STAT TROPONIN, ED: Troponin i, poc: 0 ng/mL (ref 0.00–0.08)

## 2015-10-07 LAB — SAVE SMEAR

## 2015-10-07 MED ORDER — CHLORHEXIDINE GLUCONATE 4 % EX LIQD
60.0000 mL | Freq: Once | CUTANEOUS | Status: AC
  Administered 2015-10-08: 4 via TOPICAL
  Filled 2015-10-07 (×2): qty 60

## 2015-10-07 MED ORDER — CEFAZOLIN SODIUM 1-5 GM-% IV SOLN
1.0000 g | INTRAVENOUS | Status: DC
  Filled 2015-10-07: qty 50

## 2015-10-07 MED ORDER — CEFAZOLIN SODIUM-DEXTROSE 2-3 GM-% IV SOLR
2.0000 g | INTRAVENOUS | Status: AC
  Administered 2015-10-08: 2 g via INTRAVENOUS
  Filled 2015-10-07 (×2): qty 50

## 2015-10-07 MED ORDER — SODIUM CHLORIDE 0.9 % IV SOLN: INTRAVENOUS | Status: DC

## 2015-10-07 MED ORDER — DOCUSATE SODIUM 100 MG PO CAPS
100.0000 mg | ORAL_CAPSULE | Freq: Two times a day (BID) | ORAL | Status: DC
  Administered 2015-10-07: 100 mg via ORAL

## 2015-10-07 MED ORDER — MORPHINE SULFATE (PF) 2 MG/ML IV SOLN
0.5000 mg | INTRAVENOUS | Status: DC | PRN
  Administered 2015-10-08: 0.5 mg via INTRAVENOUS
  Filled 2015-10-07 (×2): qty 1

## 2015-10-07 MED ORDER — FENTANYL CITRATE (PF) 100 MCG/2ML IJ SOLN
25.0000 ug | Freq: Once | INTRAMUSCULAR | Status: AC
Start: 2015-10-07 — End: 2015-10-07
  Administered 2015-10-07: 25 ug via INTRAVENOUS
  Filled 2015-10-07: qty 2

## 2015-10-07 MED ORDER — SODIUM CHLORIDE 0.9 % IV SOLN
INTRAVENOUS | Status: AC
  Administered 2015-10-07: 17:00:00 via INTRAVENOUS

## 2015-10-07 MED ORDER — HYDROCODONE-ACETAMINOPHEN 5-325 MG PO TABS
1.0000 | ORAL_TABLET | Freq: Four times a day (QID) | ORAL | Status: DC | PRN
  Administered 2015-10-07: 1 via ORAL
  Filled 2015-10-07: qty 1

## 2015-10-07 MED ORDER — TETANUS-DIPHTH-ACELL PERTUSSIS 5-2.5-18.5 LF-MCG/0.5 IM SUSP
0.5000 mL | Freq: Once | INTRAMUSCULAR | Status: AC
  Administered 2015-10-09: 0.5 mL via INTRAMUSCULAR
  Filled 2015-10-07: qty 0.5

## 2015-10-07 NOTE — ED Notes (Addendum)
Pt had a unwitnessed fall in the bathroom this morning. Pt has hx of dementia, but lives with her son. Skin tear to the left cheek.

## 2015-10-07 NOTE — Consult Note (Signed)
CARDIOLOGY CONSULT NOTE  Patient ID: Diane Santana MRN: [id number] DOB/AGE: 79/24/1927 79 y.o.  Admit date: 10/07/2015 Primary Physician Felix Pacini, DO  Reason for Consultation: preop risk stratification, LBBB  HPI: The patient is an 81 yr old woman with dementia and on no medications who was admitted with a fall and sustained a left femoral neck fracture. Denies syncope. Plan is for surgery tomorrow. ECG shows left bundle branch block, also seen by ECG on 06/17/15.  Denies chest pain, palpitations, and shortness of breath. POC troponin was normal.  ED physician has ordered an echocardiogram.  Spoke with son. Says mother usually walks by herself to bathroom but he noticed this morning she was limping a bit and he had to assist her to bathroom. She was standing in front of sink and he heard a loud sound and then found his mother on the floor. She does not recall the event.     No Known Allergies  Current Facility-Administered Medications  Medication Dose Route Frequency Provider Last Rate Last Dose  . Tdap (BOOSTRIX) injection 0.5 mL  0.5 mL Intramuscular Once Rolan Bucco, MD       Current Outpatient Prescriptions  Medication Sig Dispense Refill  . acetaminophen (TYLENOL) 500 MG tablet Take 500 mg by mouth every 6 (six) hours as needed for mild pain.    . Misc. Devices (3-IN-1 BEDSIDE TOILET) MISC DME 3-in- 1 bedside toilet. DDX code: Z91.81, R32, F03.90, R15.9 1 each 0    Past Medical History  Diagnosis Date  . Acute meniscal tear of knee     left knee  . Urgency of urination   . Nocturia   . Dementia     Past Surgical History  Procedure Laterality Date  . Cataract extraction w/ intraocular lens  implant, bilateral    . Dilation and curettage of uterus  2004 (APPROX)  . Knee arthroscopy with lateral menisectomy Left 03/04/2013    Procedure: LEFT KNEE ARTHROSCOPY PARTIAL MEDIAL AND LATERAL   MENISCECTOMY;  Surgeon: Drucilla Schmidt, MD;  Location: [facility];  Service: Orthopedics;  Laterality: Left;    Social History   Social History  . Marital Status: Married    Spouse Name: N/A  . Number of Children: 2  . Years of Education: N/A   Occupational History  . retired    Social History Main Topics  . Smoking status: Never Smoker   . Smokeless tobacco: Never Used  . Alcohol Use: No  . Drug Use: No  . Sexual Activity: Not on file   Other Topics Concern  . Not on file   Social History Narrative   Ms. Alvidrez lives alone. Her daughter lives 5 minutes away. Her son lives in [place] & comes to visit monthly.      No family history of premature CAD in 1st degree relatives.  Prior to Admission medications   Medication Sig Start Date End Date Taking? Authorizing Provider  acetaminophen (TYLENOL) 500 MG tablet Take 500 mg by mouth every 6 (six) hours as needed for mild pain.   Yes Historical Provider, MD  Misc. Devices (3-IN-1 BEDSIDE TOILET) MISC DME 3-in- 1 bedside toilet. DDX code: Z91.81, R32, F03.90, R15.9 07/08/15   Natalia Leatherwood, DO     Review of systems complete and found to be negative unless listed above in HPI     Physical exam Blood pressure 128/68, pulse 99, temperature 98.5 F (36.9 C), temperature source Oral, resp.  rate 16, weight 89 lb (40.37 kg), SpO2 93 %. General: NAD Neck: No JVD, no thyromegaly or thyroid nodule.  Lungs: Clear to auscultation bilaterally with normal respiratory effort. CV: Nondisplaced PMI. Regular rate and rhythm, normal S1/S2, no S3/S4, no murmur.  No peripheral edema.  No carotid bruit.  Normal pedal pulses.  Abdomen: Soft, nontender, no hepatosplenomegaly, no distention.  Skin: Intact without lesions or rashes.  Neurologic: Alert and oriented x 3.  Psych: Normal affect. Extremities: No clubbing or cyanosis.  HEENT: Normal.   ECG: Most recent ECG reviewed.  Labs:   Lab Results  Component Value Date   WBC 10.2 10/07/2015   HGB 11.5* 10/07/2015   HCT 33.5*  10/07/2015   MCV 96.3 10/07/2015   PLT 124* 10/07/2015    Recent Labs Lab 10/07/15 1227  NA 140  K 4.8  CL 106  CO2 28  BUN 21*  CREATININE 0.85  CALCIUM 9.3  GLUCOSE 99   Lab Results  Component Value Date   TROPONINI <0.03 06/17/2015   No results found for: CHOL No results found for: HDL No results found for: LDLCALC No results found for: TRIG No results found for: CHOLHDL No results found for: LDLDIRECT       Studies: Ct Head Wo Contrast  10/07/2015  CLINICAL DATA:  Pain following fall EXAM: CT HEAD WITHOUT CONTRAST CT MAXILLOFACIAL WITHOUT CONTRAST CT CERVICAL SPINE WITHOUT CONTRAST TECHNIQUE: Multidetector CT imaging of the head, cervical spine, and maxillofacial structures were performed using the standard protocol without intravenous contrast. Multiplanar CT image reconstructions of the cervical spine and maxillofacial structures were also generated. COMPARISON:  Head CT June 17, 2015 FINDINGS: CT HEAD FINDINGS Mild diffuse atrophy is stable. There is no intracranial mass, hemorrhage, extra-axial fluid collection, or midline shift. There is small vessel disease throughout the centra semiovale bilaterally, stable. No new gray-white compartment lesions are identified. No acute infarct evident. A small calcification in the periphery of the mid left occipital lobe is stable in may represent a tiny granuloma. The bony calvarium appears intact. The mastoid air cells are clear. CT MAXILLOFACIAL FINDINGS There is no fracture or dislocation. The orbits appear symmetric and normal bilaterally. There are no demonstrable intraorbital lesions. There is opacification of a superior anterior right ethmoid air cell. Other paranasal sinuses are clear. There is no air-fluid level. No bony destruction or expansion. The ostiomeatal unit complexes appear patent bilaterally. There is edema of the right inferior nasal turbinate with narrowing of the right naris. There is no nares obstruction. There is  rightward deviation of the nasal septum. Salivary glands appear unremarkable. Atherosclerotic changes noted in both carotid arteries. No adenopathy is appreciable. There is arthropathy in both temporomandibular joints. CT CERVICAL SPINE FINDINGS There is no fracture. Minimal retrolisthesis of C5 on C6 is felt to be due to underlying spondylosis. There is no other spondylolisthesis. Prevertebral soft tissues and predental space regions are normal. There is facet hypertrophy at multiple levels bilaterally. There is exit foraminal narrowing at multiple levels, most pronounced on the left at C3-4, on the left at C4-5, at C5-6 bilaterally, more severe on the right than on the left, and C6-7 bilaterally, more severe on the right than on the left. There is no frank disc extrusion or high-grade stenosis. There is calcification in each carotid artery. There are multiple nodular lesions in the thyroid consistent with multinodular goiter. IMPRESSION: CT head: Atrophy with supratentorial small vessel disease, stable. No acute infarct evident. No mass, hemorrhage, or extra-axial  fluid collection. CT maxillofacial: No fracture or dislocation. No intraorbital lesions. Mild right ethmoid sinus disease. Other paranasal sinuses are clear. There is edema of the inferior right nasal turbinate with nares narrowing on the right. Rightward deviation nasal septum. Temporomandibular joint osteoarthritic change bilaterally. Calcification in each carotid artery. CT cervical spine: Multilevel osteoarthritic change. No fracture. Minimal spondylolisthesis at C5-6 is felt to be due to underlying spondylosis. No other spondylolisthesis. Calcification noted in each carotid artery. Evidence of multinodular goiter. Consider further evaluation with thyroid ultrasound. If patient is clinically hyperthyroid, consider nuclear medicine thyroid uptake and scan. Electronically Signed   By: Bretta Bang III M.D.   On: 10/07/2015 13:37   Ct Cervical  Spine Wo Contrast  10/07/2015  CLINICAL DATA:  Pain following fall EXAM: CT HEAD WITHOUT CONTRAST CT MAXILLOFACIAL WITHOUT CONTRAST CT CERVICAL SPINE WITHOUT CONTRAST TECHNIQUE: Multidetector CT imaging of the head, cervical spine, and maxillofacial structures were performed using the standard protocol without intravenous contrast. Multiplanar CT image reconstructions of the cervical spine and maxillofacial structures were also generated. COMPARISON:  Head CT June 17, 2015 FINDINGS: CT HEAD FINDINGS Mild diffuse atrophy is stable. There is no intracranial mass, hemorrhage, extra-axial fluid collection, or midline shift. There is small vessel disease throughout the centra semiovale bilaterally, stable. No new gray-white compartment lesions are identified. No acute infarct evident. A small calcification in the periphery of the mid left occipital lobe is stable in may represent a tiny granuloma. The bony calvarium appears intact. The mastoid air cells are clear. CT MAXILLOFACIAL FINDINGS There is no fracture or dislocation. The orbits appear symmetric and normal bilaterally. There are no demonstrable intraorbital lesions. There is opacification of a superior anterior right ethmoid air cell. Other paranasal sinuses are clear. There is no air-fluid level. No bony destruction or expansion. The ostiomeatal unit complexes appear patent bilaterally. There is edema of the right inferior nasal turbinate with narrowing of the right naris. There is no nares obstruction. There is rightward deviation of the nasal septum. Salivary glands appear unremarkable. Atherosclerotic changes noted in both carotid arteries. No adenopathy is appreciable. There is arthropathy in both temporomandibular joints. CT CERVICAL SPINE FINDINGS There is no fracture. Minimal retrolisthesis of C5 on C6 is felt to be due to underlying spondylosis. There is no other spondylolisthesis. Prevertebral soft tissues and predental space regions are normal.  There is facet hypertrophy at multiple levels bilaterally. There is exit foraminal narrowing at multiple levels, most pronounced on the left at C3-4, on the left at C4-5, at C5-6 bilaterally, more severe on the right than on the left, and C6-7 bilaterally, more severe on the right than on the left. There is no frank disc extrusion or high-grade stenosis. There is calcification in each carotid artery. There are multiple nodular lesions in the thyroid consistent with multinodular goiter. IMPRESSION: CT head: Atrophy with supratentorial small vessel disease, stable. No acute infarct evident. No mass, hemorrhage, or extra-axial fluid collection. CT maxillofacial: No fracture or dislocation. No intraorbital lesions. Mild right ethmoid sinus disease. Other paranasal sinuses are clear. There is edema of the inferior right nasal turbinate with nares narrowing on the right. Rightward deviation nasal septum. Temporomandibular joint osteoarthritic change bilaterally. Calcification in each carotid artery. CT cervical spine: Multilevel osteoarthritic change. No fracture. Minimal spondylolisthesis at C5-6 is felt to be due to underlying spondylosis. No other spondylolisthesis. Calcification noted in each carotid artery. Evidence of multinodular goiter. Consider further evaluation with thyroid ultrasound. If patient is clinically hyperthyroid, consider nuclear  medicine thyroid uptake and scan. Electronically Signed   By: Bretta Bang III M.D.   On: 10/07/2015 13:37   Dg Chest Port 1 View  10/07/2015  CLINICAL DATA:  Preop.  Hip fracture. EXAM: PORTABLE CHEST 1 VIEW COMPARISON:  None. FINDINGS: Upper normal heart size. Clear lungs. No pneumothorax. No obvious bony deformity. Osteopenia. IMPRESSION: No active disease. Electronically Signed   By: Jolaine Click M.D.   On: 10/07/2015 14:36   Dg Hips Bilat With Pelvis Min 5 Views  10/07/2015  CLINICAL DATA:  Pain following fall EXAM: DG HIP (WITH OR WITHOUT PELVIS) 5+V BILAT  COMPARISON:  None. FINDINGS: Frontal pelvis as well as frontal lateral views of each hip obtained. There is evidence of an impaction type injury at the subcapital femoral neck level on the left. There is no appreciable displacement of fracture fragments. There is no other fracture apparent. No dislocation. There is mild symmetric narrowing of both hip joints. Bones are osteoporotic. There is degenerative change in the visualized lumbar spine. IMPRESSION: Evidence of impaction type fracture in the subcapital femoral neck region on the left. No other evidence of fracture. No dislocation. Symmetric narrowing both hip joints. Bones osteoporotic. Electronically Signed   By: Bretta Bang III M.D.   On: 10/07/2015 13:51   Ct Maxillofacial Wo Cm  10/07/2015  CLINICAL DATA:  Pain following fall EXAM: CT HEAD WITHOUT CONTRAST CT MAXILLOFACIAL WITHOUT CONTRAST CT CERVICAL SPINE WITHOUT CONTRAST TECHNIQUE: Multidetector CT imaging of the head, cervical spine, and maxillofacial structures were performed using the standard protocol without intravenous contrast. Multiplanar CT image reconstructions of the cervical spine and maxillofacial structures were also generated. COMPARISON:  Head CT June 17, 2015 FINDINGS: CT HEAD FINDINGS Mild diffuse atrophy is stable. There is no intracranial mass, hemorrhage, extra-axial fluid collection, or midline shift. There is small vessel disease throughout the centra semiovale bilaterally, stable. No new gray-white compartment lesions are identified. No acute infarct evident. A small calcification in the periphery of the mid left occipital lobe is stable in may represent a tiny granuloma. The bony calvarium appears intact. The mastoid air cells are clear. CT MAXILLOFACIAL FINDINGS There is no fracture or dislocation. The orbits appear symmetric and normal bilaterally. There are no demonstrable intraorbital lesions. There is opacification of a superior anterior right ethmoid air cell.  Other paranasal sinuses are clear. There is no air-fluid level. No bony destruction or expansion. The ostiomeatal unit complexes appear patent bilaterally. There is edema of the right inferior nasal turbinate with narrowing of the right naris. There is no nares obstruction. There is rightward deviation of the nasal septum. Salivary glands appear unremarkable. Atherosclerotic changes noted in both carotid arteries. No adenopathy is appreciable. There is arthropathy in both temporomandibular joints. CT CERVICAL SPINE FINDINGS There is no fracture. Minimal retrolisthesis of C5 on C6 is felt to be due to underlying spondylosis. There is no other spondylolisthesis. Prevertebral soft tissues and predental space regions are normal. There is facet hypertrophy at multiple levels bilaterally. There is exit foraminal narrowing at multiple levels, most pronounced on the left at C3-4, on the left at C4-5, at C5-6 bilaterally, more severe on the right than on the left, and C6-7 bilaterally, more severe on the right than on the left. There is no frank disc extrusion or high-grade stenosis. There is calcification in each carotid artery. There are multiple nodular lesions in the thyroid consistent with multinodular goiter. IMPRESSION: CT head: Atrophy with supratentorial small vessel disease, stable. No acute infarct  evident. No mass, hemorrhage, or extra-axial fluid collection. CT maxillofacial: No fracture or dislocation. No intraorbital lesions. Mild right ethmoid sinus disease. Other paranasal sinuses are clear. There is edema of the inferior right nasal turbinate with nares narrowing on the right. Rightward deviation nasal septum. Temporomandibular joint osteoarthritic change bilaterally. Calcification in each carotid artery. CT cervical spine: Multilevel osteoarthritic change. No fracture. Minimal spondylolisthesis at C5-6 is felt to be due to underlying spondylosis. No other spondylolisthesis. Calcification noted in each  carotid artery. Evidence of multinodular goiter. Consider further evaluation with thyroid ultrasound. If patient is clinically hyperthyroid, consider nuclear medicine thyroid uptake and scan. Electronically Signed   By: Bretta BangWilliam  Woodruff III M.D.   On: 10/07/2015 13:37    ASSESSMENT AND PLAN:  1. Preoperative risk stratification: Elevated perioperative risk for major adverse cardiac event due to age but not prohibitive, as mortality rate is high without surgery. Await echocardiogram for LVEF assessment. Asymptomatic from a cardiovascular standpoint.  2. LBBB: Seen in August. Does not represent acute MI. POC troponin normal.   Signed: Prentice DockerSuresh Koneswaran, M.D., F.A.C.C.  10/07/2015, 4:07 PM

## 2015-10-07 NOTE — ED Provider Notes (Signed)
CSN: [id number]     Arrival date & time 10/07/15  1107 History   First MD Initiated Contact with Patient 10/07/15 1132     Chief Complaint  Patient presents with  . Fall     (Consider location/radiation/quality/duration/timing/severity/associated sxs/prior Treatment) HPI Comments: Patient presents after a fall. She has a history of dementia. She lives at home with her son and he states that she's currently on no medications. The son, the patient was in the bathroom washing herself off in the sink this morning when he heard a thud. He found her on the bathroom floor. She was awake and he doesn't think that she passed out. She does have swelling in a skin tear to her left cheek. He's not sure when her last tetanus shot was. He does report any recent illnesses. No fevers coughing or cold symptoms. She hasn't been more short of breath and normal. There's been no vomiting or diarrhea. He does note that the patient was overall weaker than normal yesterday and that he had to help her get in and out of chairs more than he normally does. He noted this morning that she wasn't lifting up her left leg as she normally does and was grimacing like it was hurting her. He doesn't report any other recent falls other than about 3-4 months ago. He doesn't note any facial swelling or unilateral weakness of the upper extremities.  Patient is a 81 y.o. female presenting with fall.  Fall    Past Medical History  Diagnosis Date  . Acute meniscal tear of knee     left knee  . Urgency of urination   . Nocturia   . Dementia    Past Surgical History  Procedure Laterality Date  . Cataract extraction w/ intraocular lens  implant, bilateral    . Dilation and curettage of uterus  2004 (APPROX)  . Knee arthroscopy with lateral menisectomy Left 03/04/2013    Procedure: LEFT KNEE ARTHROSCOPY PARTIAL MEDIAL AND LATERAL   MENISCECTOMY;  Surgeon: Drucilla Schmidt, MD;  Location: [facility];  Service:  Orthopedics;  Laterality: Left;   Family History  Problem Relation Age of Onset  . Cancer Mother     Breast   Social History  Substance Use Topics  . Smoking status: Never Smoker   . Smokeless tobacco: Never Used  . Alcohol Use: No   OB History    No data available     Review of Systems  Unable to perform ROS: Dementia      Allergies  Review of patient's allergies indicates no known allergies.  Home Medications   Prior to Admission medications   Medication Sig Start Date End Date Taking? Authorizing Provider  acetaminophen (TYLENOL) 500 MG tablet Take 500 mg by mouth every 6 (six) hours as needed for mild pain.   Yes Historical Provider, MD  Misc. Devices (3-IN-1 BEDSIDE TOILET) MISC DME 3-in- 1 bedside toilet. DDX code: Z91.81, R32, F03.90, R15.9 07/08/15   Renee A Kuneff, DO   BP 128/68 mmHg  Pulse 99  Temp(Src) 98.5 F (36.9 C) (Oral)  Resp 16  Wt 89 lb (40.37 kg)  SpO2 93% Physical Exam  Constitutional: She appears well-developed and well-nourished.  HENT:  Head: Normocephalic.  Positive swelling and mild underlying tenderness to the left maxilla with an overlying skin tear. There is no active bleeding.  Eyes: Pupils are equal, round, and reactive to light.  Neck:  Mild tenderness to the mid and lower cervical spine.  No pain to the thoracic or lumbosacral spine. No step-offs or deformities.  Cardiovascular: Normal rate, regular rhythm and normal heart sounds.   Pulmonary/Chest: Effort normal and breath sounds normal. No respiratory distress. She has no wheezes. She has no rales. She exhibits no tenderness.  Abdominal: Soft. Bowel sounds are normal. There is no tenderness. There is no rebound and no guarding.  Musculoskeletal: Normal range of motion. She exhibits no edema.  Lymphadenopathy:    She has no cervical adenopathy.  Neurological: She is alert.  Oriented to person and place. She is moving all extremities with no drift. She is not following commands  well but does not seem to have unilateral weakness or loss of sensation. She does have pain on range of motion of the left hip. There is no palpable tenderness to the knee or ankle. Distal pulses are intact. There's no facial drooping.  Skin: Skin is warm and dry. No rash noted.  Psychiatric: She has a normal mood and affect.    ED Course  Procedures (including critical care time) Labs Review Labs Reviewed  BASIC METABOLIC PANEL - Abnormal; Notable for the following:    BUN 21 (*)    GFR calc non Af Amer 60 (*)    All other components within normal limits  CBC - Abnormal; Notable for the following:    RBC 3.48 (*)    Hemoglobin 11.5 (*)    HCT 33.5 (*)    Platelets 124 (*)    All other components within normal limits  URINALYSIS, ROUTINE W REFLEX MICROSCOPIC (NOT AT [facility])  I-STAT TROPOININ, ED  CBG MONITORING, ED    Imaging Review Ct Head Wo Contrast  10/07/2015  CLINICAL DATA:  Pain following fall EXAM: CT HEAD WITHOUT CONTRAST CT MAXILLOFACIAL WITHOUT CONTRAST CT CERVICAL SPINE WITHOUT CONTRAST TECHNIQUE: Multidetector CT imaging of the head, cervical spine, and maxillofacial structures were performed using the standard protocol without intravenous contrast. Multiplanar CT image reconstructions of the cervical spine and maxillofacial structures were also generated. COMPARISON:  Head CT June 17, 2015 FINDINGS: CT HEAD FINDINGS Mild diffuse atrophy is stable. There is no intracranial mass, hemorrhage, extra-axial fluid collection, or midline shift. There is small vessel disease throughout the centra semiovale bilaterally, stable. No new gray-white compartment lesions are identified. No acute infarct evident. A small calcification in the periphery of the mid left occipital lobe is stable in may represent a tiny granuloma. The bony calvarium appears intact. The mastoid air cells are clear. CT MAXILLOFACIAL FINDINGS There is no fracture or dislocation. The orbits appear symmetric and normal  bilaterally. There are no demonstrable intraorbital lesions. There is opacification of a superior anterior right ethmoid air cell. Other paranasal sinuses are clear. There is no air-fluid level. No bony destruction or expansion. The ostiomeatal unit complexes appear patent bilaterally. There is edema of the right inferior nasal turbinate with narrowing of the right naris. There is no nares obstruction. There is rightward deviation of the nasal septum. Salivary glands appear unremarkable. Atherosclerotic changes noted in both carotid arteries. No adenopathy is appreciable. There is arthropathy in both temporomandibular joints. CT CERVICAL SPINE FINDINGS There is no fracture. Minimal retrolisthesis of C5 on C6 is felt to be due to underlying spondylosis. There is no other spondylolisthesis. Prevertebral soft tissues and predental space regions are normal. There is facet hypertrophy at multiple levels bilaterally. There is exit foraminal narrowing at multiple levels, most pronounced on the left at C3-4, on the left at C4-5, at C5-6 bilaterally,  more severe on the right than on the left, and C6-7 bilaterally, more severe on the right than on the left. There is no frank disc extrusion or high-grade stenosis. There is calcification in each carotid artery. There are multiple nodular lesions in the thyroid consistent with multinodular goiter. IMPRESSION: CT head: Atrophy with supratentorial small vessel disease, stable. No acute infarct evident. No mass, hemorrhage, or extra-axial fluid collection. CT maxillofacial: No fracture or dislocation. No intraorbital lesions. Mild right ethmoid sinus disease. Other paranasal sinuses are clear. There is edema of the inferior right nasal turbinate with nares narrowing on the right. Rightward deviation nasal septum. Temporomandibular joint osteoarthritic change bilaterally. Calcification in each carotid artery. CT cervical spine: Multilevel osteoarthritic change. No fracture. Minimal  spondylolisthesis at C5-6 is felt to be due to underlying spondylosis. No other spondylolisthesis. Calcification noted in each carotid artery. Evidence of multinodular goiter. Consider further evaluation with thyroid ultrasound. If patient is clinically hyperthyroid, consider nuclear medicine thyroid uptake and scan. Electronically Signed   By: Bretta Bang III M.D.   On: 10/07/2015 13:37   Ct Cervical Spine Wo Contrast  10/07/2015  CLINICAL DATA:  Pain following fall EXAM: CT HEAD WITHOUT CONTRAST CT MAXILLOFACIAL WITHOUT CONTRAST CT CERVICAL SPINE WITHOUT CONTRAST TECHNIQUE: Multidetector CT imaging of the head, cervical spine, and maxillofacial structures were performed using the standard protocol without intravenous contrast. Multiplanar CT image reconstructions of the cervical spine and maxillofacial structures were also generated. COMPARISON:  Head CT June 17, 2015 FINDINGS: CT HEAD FINDINGS Mild diffuse atrophy is stable. There is no intracranial mass, hemorrhage, extra-axial fluid collection, or midline shift. There is small vessel disease throughout the centra semiovale bilaterally, stable. No new gray-white compartment lesions are identified. No acute infarct evident. A small calcification in the periphery of the mid left occipital lobe is stable in may represent a tiny granuloma. The bony calvarium appears intact. The mastoid air cells are clear. CT MAXILLOFACIAL FINDINGS There is no fracture or dislocation. The orbits appear symmetric and normal bilaterally. There are no demonstrable intraorbital lesions. There is opacification of a superior anterior right ethmoid air cell. Other paranasal sinuses are clear. There is no air-fluid level. No bony destruction or expansion. The ostiomeatal unit complexes appear patent bilaterally. There is edema of the right inferior nasal turbinate with narrowing of the right naris. There is no nares obstruction. There is rightward deviation of the nasal septum.  Salivary glands appear unremarkable. Atherosclerotic changes noted in both carotid arteries. No adenopathy is appreciable. There is arthropathy in both temporomandibular joints. CT CERVICAL SPINE FINDINGS There is no fracture. Minimal retrolisthesis of C5 on C6 is felt to be due to underlying spondylosis. There is no other spondylolisthesis. Prevertebral soft tissues and predental space regions are normal. There is facet hypertrophy at multiple levels bilaterally. There is exit foraminal narrowing at multiple levels, most pronounced on the left at C3-4, on the left at C4-5, at C5-6 bilaterally, more severe on the right than on the left, and C6-7 bilaterally, more severe on the right than on the left. There is no frank disc extrusion or high-grade stenosis. There is calcification in each carotid artery. There are multiple nodular lesions in the thyroid consistent with multinodular goiter. IMPRESSION: CT head: Atrophy with supratentorial small vessel disease, stable. No acute infarct evident. No mass, hemorrhage, or extra-axial fluid collection. CT maxillofacial: No fracture or dislocation. No intraorbital lesions. Mild right ethmoid sinus disease. Other paranasal sinuses are clear. There is edema of the inferior right  nasal turbinate with nares narrowing on the right. Rightward deviation nasal septum. Temporomandibular joint osteoarthritic change bilaterally. Calcification in each carotid artery. CT cervical spine: Multilevel osteoarthritic change. No fracture. Minimal spondylolisthesis at C5-6 is felt to be due to underlying spondylosis. No other spondylolisthesis. Calcification noted in each carotid artery. Evidence of multinodular goiter. Consider further evaluation with thyroid ultrasound. If patient is clinically hyperthyroid, consider nuclear medicine thyroid uptake and scan. Electronically Signed   By: Bretta Bang III M.D.   On: 10/07/2015 13:37   Dg Chest Port 1 View  10/07/2015  CLINICAL DATA:   Preop.  Hip fracture. EXAM: PORTABLE CHEST 1 VIEW COMPARISON:  None. FINDINGS: Upper normal heart size. Clear lungs. No pneumothorax. No obvious bony deformity. Osteopenia. IMPRESSION: No active disease. Electronically Signed   By: Jolaine Click M.D.   On: 10/07/2015 14:36   Dg Hips Bilat With Pelvis Min 5 Views  10/07/2015  CLINICAL DATA:  Pain following fall EXAM: DG HIP (WITH OR WITHOUT PELVIS) 5+V BILAT COMPARISON:  None. FINDINGS: Frontal pelvis as well as frontal lateral views of each hip obtained. There is evidence of an impaction type injury at the subcapital femoral neck level on the left. There is no appreciable displacement of fracture fragments. There is no other fracture apparent. No dislocation. There is mild symmetric narrowing of both hip joints. Bones are osteoporotic. There is degenerative change in the visualized lumbar spine. IMPRESSION: Evidence of impaction type fracture in the subcapital femoral neck region on the left. No other evidence of fracture. No dislocation. Symmetric narrowing both hip joints. Bones osteoporotic. Electronically Signed   By: Bretta Bang III M.D.   On: 10/07/2015 13:51   Ct Maxillofacial Wo Cm  10/07/2015  CLINICAL DATA:  Pain following fall EXAM: CT HEAD WITHOUT CONTRAST CT MAXILLOFACIAL WITHOUT CONTRAST CT CERVICAL SPINE WITHOUT CONTRAST TECHNIQUE: Multidetector CT imaging of the head, cervical spine, and maxillofacial structures were performed using the standard protocol without intravenous contrast. Multiplanar CT image reconstructions of the cervical spine and maxillofacial structures were also generated. COMPARISON:  Head CT June 17, 2015 FINDINGS: CT HEAD FINDINGS Mild diffuse atrophy is stable. There is no intracranial mass, hemorrhage, extra-axial fluid collection, or midline shift. There is small vessel disease throughout the centra semiovale bilaterally, stable. No new gray-white compartment lesions are identified. No acute infarct evident. A  small calcification in the periphery of the mid left occipital lobe is stable in may represent a tiny granuloma. The bony calvarium appears intact. The mastoid air cells are clear. CT MAXILLOFACIAL FINDINGS There is no fracture or dislocation. The orbits appear symmetric and normal bilaterally. There are no demonstrable intraorbital lesions. There is opacification of a superior anterior right ethmoid air cell. Other paranasal sinuses are clear. There is no air-fluid level. No bony destruction or expansion. The ostiomeatal unit complexes appear patent bilaterally. There is edema of the right inferior nasal turbinate with narrowing of the right naris. There is no nares obstruction. There is rightward deviation of the nasal septum. Salivary glands appear unremarkable. Atherosclerotic changes noted in both carotid arteries. No adenopathy is appreciable. There is arthropathy in both temporomandibular joints. CT CERVICAL SPINE FINDINGS There is no fracture. Minimal retrolisthesis of C5 on C6 is felt to be due to underlying spondylosis. There is no other spondylolisthesis. Prevertebral soft tissues and predental space regions are normal. There is facet hypertrophy at multiple levels bilaterally. There is exit foraminal narrowing at multiple levels, most pronounced on the left at C3-4, on the  left at C4-5, at C5-6 bilaterally, more severe on the right than on the left, and C6-7 bilaterally, more severe on the right than on the left. There is no frank disc extrusion or high-grade stenosis. There is calcification in each carotid artery. There are multiple nodular lesions in the thyroid consistent with multinodular goiter. IMPRESSION: CT head: Atrophy with supratentorial small vessel disease, stable. No acute infarct evident. No mass, hemorrhage, or extra-axial fluid collection. CT maxillofacial: No fracture or dislocation. No intraorbital lesions. Mild right ethmoid sinus disease. Other paranasal sinuses are clear. There is  edema of the inferior right nasal turbinate with nares narrowing on the right. Rightward deviation nasal septum. Temporomandibular joint osteoarthritic change bilaterally. Calcification in each carotid artery. CT cervical spine: Multilevel osteoarthritic change. No fracture. Minimal spondylolisthesis at C5-6 is felt to be due to underlying spondylosis. No other spondylolisthesis. Calcification noted in each carotid artery. Evidence of multinodular goiter. Consider further evaluation with thyroid ultrasound. If patient is clinically hyperthyroid, consider nuclear medicine thyroid uptake and scan. Electronically Signed   By: Bretta Bang III M.D.   On: 10/07/2015 13:37   I have personally reviewed and evaluated these images and lab results as part of my medical decision-making.   EKG Interpretation   Date/Time:  Thursday October 07 2015 11:17:35 EST Ventricular Rate:  101 PR Interval:  128 QRS Duration: 136 QT Interval:  384 QTC Calculation: 497 R Axis:   -87 Text Interpretation:  Sinus tachycardia with occasional Premature  ventricular complexes Left axis deviation Right bundle branch block Left  ventricular hypertrophy with repolarization abnormality Anterior infarct ,  age undetermined Abnormal ECG SINCE LAST TRACING HEART RATE HAS INCREASED  Confirmed by Penelope Fittro  MD, Walaa Carel ([id number]) on 10/07/2015 12:01:24 PM      MDM   Final diagnoses:  Hip fracture, left, closed, initial encounter [facility])    Patient has evidence of a left hip fracture. I don't see other evidence of injuries. She was complaining of some pain in her left knee but she doesn't have any tenderness on palpation of the knee. I feel this is likely referred from the hip fracture. I did consult Dr. Victorino Dike with orthopedics who will evaluate the patient. He advises that operative repair will likely not be done until the morning. I spoke with [facility] with the hospitalist service who will admit the patient.    Rolan Bucco,  MD 10/07/15 [phone]

## 2015-10-07 NOTE — Consult Note (Signed)
Reason for Consult:  Left hip pain Referring Physician: Dr. Eustace Quail is an 81 y.o. female.  HPI:  81 y/o female with PMH of dementia fell this morning and was brought to the ER via EMS.  She lives with her son.  He says that yesterday she was not walking and c/o pain.  This morning he heard her fall and says she was unable to get back up.  She did not have a fall prior to this morning that he knows of.  She c/o pain in the left hip that is worse with motion and better with rest.  No h/o previous left hip fracture or surgery.  She takes no meds.  Son and daughter are both [place].  Past Medical History  Diagnosis Date  . Acute meniscal tear of knee     left knee  . Urgency of urination   . Nocturia   . Dementia     Past Surgical History  Procedure Laterality Date  . Cataract extraction w/ intraocular lens  implant, bilateral    . Dilation and curettage of uterus  2004 (APPROX)  . Knee arthroscopy with lateral menisectomy Left 03/04/2013    Procedure: LEFT KNEE ARTHROSCOPY PARTIAL MEDIAL AND LATERAL   MENISCECTOMY;  Surgeon: Magnus Sinning, MD;  Location: [facility];  Service: Orthopedics;  Laterality: Left;    Family History  Problem Relation Age of Onset  . Cancer Mother     Breast    Social History:  reports that she has never smoked. She has never used smokeless tobacco. She reports that she does not drink alcohol or use illicit drugs.  Allergies: No Known Allergies  Medications: I have reviewed the patient's current medications.  Results for orders placed or performed during the hospital encounter of 10/07/15 (from the past 48 hour(s))  CBC     Status: Abnormal   Collection Time: 10/07/15 12:05 PM  Result Value Ref Range   WBC 10.2 4.0 - 10.5 K/uL   RBC 3.48 (L) 3.87 - 5.11 MIL/uL   Hemoglobin 11.5 (L) 12.0 - 15.0 g/dL   HCT 33.5 (L) 36.0 - 46.0 %   MCV 96.3 78.0 - 100.0 fL   MCH 33.0 26.0 - 34.0 pg   MCHC 34.3 30.0 - 36.0 g/dL   RDW 13.8  11.5 - 15.5 %   Platelets 124 (L) 150 - 400 K/uL  Basic metabolic panel     Status: Abnormal   Collection Time: 10/07/15 12:27 PM  Result Value Ref Range   Sodium 140 135 - 145 mmol/L   Potassium 4.8 3.5 - 5.1 mmol/L   Chloride 106 101 - 111 mmol/L   CO2 28 22 - 32 mmol/L   Glucose, Bld 99 65 - 99 mg/dL   BUN 21 (H) 6 - 20 mg/dL   Creatinine, Ser 0.85 0.44 - 1.00 mg/dL   Calcium 9.3 8.9 - 10.3 mg/dL   GFR calc non Af Amer 60 (L) >60 mL/min   GFR calc Af Amer >60 >60 mL/min    Comment: (NOTE) The eGFR has been calculated using the CKD EPI equation. This calculation has not been validated in all clinical situations. eGFR's persistently <60 mL/min signify possible Chronic Kidney Disease.    Anion gap 6 5 - 15  I-stat troponin, ED     Status: None   Collection Time: 10/07/15 12:35 PM  Result Value Ref Range   Troponin i, poc 0.00 0.00 - 0.08 ng/mL  Comment 3            Comment: Due to the release kinetics of cTnI, a negative result within the first hours of the onset of symptoms does not rule out myocardial infarction with certainty. If myocardial infarction is still suspected, repeat the test at appropriate intervals.     Ct Head Wo Contrast  10/07/2015  CLINICAL DATA:  Pain following fall EXAM: CT HEAD WITHOUT CONTRAST CT MAXILLOFACIAL WITHOUT CONTRAST CT CERVICAL SPINE WITHOUT CONTRAST TECHNIQUE: Multidetector CT imaging of the head, cervical spine, and maxillofacial structures were performed using the standard protocol without intravenous contrast. Multiplanar CT image reconstructions of the cervical spine and maxillofacial structures were also generated. COMPARISON:  Head CT June 17, 2015 FINDINGS: CT HEAD FINDINGS Mild diffuse atrophy is stable. There is no intracranial mass, hemorrhage, extra-axial fluid collection, or midline shift. There is small vessel disease throughout the centra semiovale bilaterally, stable. No new gray-white compartment lesions are identified. No  acute infarct evident. A small calcification in the periphery of the mid left occipital lobe is stable in may represent a tiny granuloma. The bony calvarium appears intact. The mastoid air cells are clear. CT MAXILLOFACIAL FINDINGS There is no fracture or dislocation. The orbits appear symmetric and normal bilaterally. There are no demonstrable intraorbital lesions. There is opacification of a superior anterior right ethmoid air cell. Other paranasal sinuses are clear. There is no air-fluid level. No bony destruction or expansion. The ostiomeatal unit complexes appear patent bilaterally. There is edema of the right inferior nasal turbinate with narrowing of the right naris. There is no nares obstruction. There is rightward deviation of the nasal septum. Salivary glands appear unremarkable. Atherosclerotic changes noted in both carotid arteries. No adenopathy is appreciable. There is arthropathy in both temporomandibular joints. CT CERVICAL SPINE FINDINGS There is no fracture. Minimal retrolisthesis of C5 on C6 is felt to be due to underlying spondylosis. There is no other spondylolisthesis. Prevertebral soft tissues and predental space regions are normal. There is facet hypertrophy at multiple levels bilaterally. There is exit foraminal narrowing at multiple levels, most pronounced on the left at C3-4, on the left at C4-5, at C5-6 bilaterally, more severe on the right than on the left, and C6-7 bilaterally, more severe on the right than on the left. There is no frank disc extrusion or high-grade stenosis. There is calcification in each carotid artery. There are multiple nodular lesions in the thyroid consistent with multinodular goiter. IMPRESSION: CT head: Atrophy with supratentorial small vessel disease, stable. No acute infarct evident. No mass, hemorrhage, or extra-axial fluid collection. CT maxillofacial: No fracture or dislocation. No intraorbital lesions. Mild right ethmoid sinus disease. Other paranasal  sinuses are clear. There is edema of the inferior right nasal turbinate with nares narrowing on the right. Rightward deviation nasal septum. Temporomandibular joint osteoarthritic change bilaterally. Calcification in each carotid artery. CT cervical spine: Multilevel osteoarthritic change. No fracture. Minimal spondylolisthesis at C5-6 is felt to be due to underlying spondylosis. No other spondylolisthesis. Calcification noted in each carotid artery. Evidence of multinodular goiter. Consider further evaluation with thyroid ultrasound. If patient is clinically hyperthyroid, consider nuclear medicine thyroid uptake and scan. Electronically Signed   By: Lowella Grip III M.D.   On: 10/07/2015 13:37   Ct Cervical Spine Wo Contrast  10/07/2015  CLINICAL DATA:  Pain following fall EXAM: CT HEAD WITHOUT CONTRAST CT MAXILLOFACIAL WITHOUT CONTRAST CT CERVICAL SPINE WITHOUT CONTRAST TECHNIQUE: Multidetector CT imaging of the head, cervical spine, and maxillofacial structures  were performed using the standard protocol without intravenous contrast. Multiplanar CT image reconstructions of the cervical spine and maxillofacial structures were also generated. COMPARISON:  Head CT June 17, 2015 FINDINGS: CT HEAD FINDINGS Mild diffuse atrophy is stable. There is no intracranial mass, hemorrhage, extra-axial fluid collection, or midline shift. There is small vessel disease throughout the centra semiovale bilaterally, stable. No new gray-white compartment lesions are identified. No acute infarct evident. A small calcification in the periphery of the mid left occipital lobe is stable in may represent a tiny granuloma. The bony calvarium appears intact. The mastoid air cells are clear. CT MAXILLOFACIAL FINDINGS There is no fracture or dislocation. The orbits appear symmetric and normal bilaterally. There are no demonstrable intraorbital lesions. There is opacification of a superior anterior right ethmoid air cell. Other  paranasal sinuses are clear. There is no air-fluid level. No bony destruction or expansion. The ostiomeatal unit complexes appear patent bilaterally. There is edema of the right inferior nasal turbinate with narrowing of the right naris. There is no nares obstruction. There is rightward deviation of the nasal septum. Salivary glands appear unremarkable. Atherosclerotic changes noted in both carotid arteries. No adenopathy is appreciable. There is arthropathy in both temporomandibular joints. CT CERVICAL SPINE FINDINGS There is no fracture. Minimal retrolisthesis of C5 on C6 is felt to be due to underlying spondylosis. There is no other spondylolisthesis. Prevertebral soft tissues and predental space regions are normal. There is facet hypertrophy at multiple levels bilaterally. There is exit foraminal narrowing at multiple levels, most pronounced on the left at C3-4, on the left at C4-5, at C5-6 bilaterally, more severe on the right than on the left, and C6-7 bilaterally, more severe on the right than on the left. There is no frank disc extrusion or high-grade stenosis. There is calcification in each carotid artery. There are multiple nodular lesions in the thyroid consistent with multinodular goiter. IMPRESSION: CT head: Atrophy with supratentorial small vessel disease, stable. No acute infarct evident. No mass, hemorrhage, or extra-axial fluid collection. CT maxillofacial: No fracture or dislocation. No intraorbital lesions. Mild right ethmoid sinus disease. Other paranasal sinuses are clear. There is edema of the inferior right nasal turbinate with nares narrowing on the right. Rightward deviation nasal septum. Temporomandibular joint osteoarthritic change bilaterally. Calcification in each carotid artery. CT cervical spine: Multilevel osteoarthritic change. No fracture. Minimal spondylolisthesis at C5-6 is felt to be due to underlying spondylosis. No other spondylolisthesis. Calcification noted in each carotid  artery. Evidence of multinodular goiter. Consider further evaluation with thyroid ultrasound. If patient is clinically hyperthyroid, consider nuclear medicine thyroid uptake and scan. Electronically Signed   By: Lowella Grip III M.D.   On: 10/07/2015 13:37   Dg Chest Port 1 View  10/07/2015  CLINICAL DATA:  Preop.  Hip fracture. EXAM: PORTABLE CHEST 1 VIEW COMPARISON:  None. FINDINGS: Upper normal heart size. Clear lungs. No pneumothorax. No obvious bony deformity. Osteopenia. IMPRESSION: No active disease. Electronically Signed   By: Marybelle Killings M.D.   On: 10/07/2015 14:36   Dg Hips Bilat With Pelvis Min 5 Views  10/07/2015  CLINICAL DATA:  Pain following fall EXAM: DG HIP (WITH OR WITHOUT PELVIS) 5+V BILAT COMPARISON:  None. FINDINGS: Frontal pelvis as well as frontal lateral views of each hip obtained. There is evidence of an impaction type injury at the subcapital femoral neck level on the left. There is no appreciable displacement of fracture fragments. There is no other fracture apparent. No dislocation. There is mild symmetric  narrowing of both hip joints. Bones are osteoporotic. There is degenerative change in the visualized lumbar spine. IMPRESSION: Evidence of impaction type fracture in the subcapital femoral neck region on the left. No other evidence of fracture. No dislocation. Symmetric narrowing both hip joints. Bones osteoporotic. Electronically Signed   By: Lowella Grip III M.D.   On: 10/07/2015 13:51   Ct Maxillofacial Wo Cm  10/07/2015  CLINICAL DATA:  Pain following fall EXAM: CT HEAD WITHOUT CONTRAST CT MAXILLOFACIAL WITHOUT CONTRAST CT CERVICAL SPINE WITHOUT CONTRAST TECHNIQUE: Multidetector CT imaging of the head, cervical spine, and maxillofacial structures were performed using the standard protocol without intravenous contrast. Multiplanar CT image reconstructions of the cervical spine and maxillofacial structures were also generated. COMPARISON:  Head CT June 17, 2015  FINDINGS: CT HEAD FINDINGS Mild diffuse atrophy is stable. There is no intracranial mass, hemorrhage, extra-axial fluid collection, or midline shift. There is small vessel disease throughout the centra semiovale bilaterally, stable. No new gray-white compartment lesions are identified. No acute infarct evident. A small calcification in the periphery of the mid left occipital lobe is stable in may represent a tiny granuloma. The bony calvarium appears intact. The mastoid air cells are clear. CT MAXILLOFACIAL FINDINGS There is no fracture or dislocation. The orbits appear symmetric and normal bilaterally. There are no demonstrable intraorbital lesions. There is opacification of a superior anterior right ethmoid air cell. Other paranasal sinuses are clear. There is no air-fluid level. No bony destruction or expansion. The ostiomeatal unit complexes appear patent bilaterally. There is edema of the right inferior nasal turbinate with narrowing of the right naris. There is no nares obstruction. There is rightward deviation of the nasal septum. Salivary glands appear unremarkable. Atherosclerotic changes noted in both carotid arteries. No adenopathy is appreciable. There is arthropathy in both temporomandibular joints. CT CERVICAL SPINE FINDINGS There is no fracture. Minimal retrolisthesis of C5 on C6 is felt to be due to underlying spondylosis. There is no other spondylolisthesis. Prevertebral soft tissues and predental space regions are normal. There is facet hypertrophy at multiple levels bilaterally. There is exit foraminal narrowing at multiple levels, most pronounced on the left at C3-4, on the left at C4-5, at C5-6 bilaterally, more severe on the right than on the left, and C6-7 bilaterally, more severe on the right than on the left. There is no frank disc extrusion or high-grade stenosis. There is calcification in each carotid artery. There are multiple nodular lesions in the thyroid consistent with multinodular  goiter. IMPRESSION: CT head: Atrophy with supratentorial small vessel disease, stable. No acute infarct evident. No mass, hemorrhage, or extra-axial fluid collection. CT maxillofacial: No fracture or dislocation. No intraorbital lesions. Mild right ethmoid sinus disease. Other paranasal sinuses are clear. There is edema of the inferior right nasal turbinate with nares narrowing on the right. Rightward deviation nasal septum. Temporomandibular joint osteoarthritic change bilaterally. Calcification in each carotid artery. CT cervical spine: Multilevel osteoarthritic change. No fracture. Minimal spondylolisthesis at C5-6 is felt to be due to underlying spondylosis. No other spondylolisthesis. Calcification noted in each carotid artery. Evidence of multinodular goiter. Consider further evaluation with thyroid ultrasound. If patient is clinically hyperthyroid, consider nuclear medicine thyroid uptake and scan. Electronically Signed   By: Lowella Grip III M.D.   On: 10/07/2015 13:37    ROS:  No recent f/c/n/v/wt loss.  + inability to walk since yesterday PE:  Blood pressure 119/79, pulse 104, temperature 98.5 F (36.9 C), temperature source Oral, resp. rate 18, weight 40.37  kg (89 lb), SpO2 92 %. Elderly cachectic female in nad.  Variably alert and somnolent.  EOMI.  Oriented to person.  Resp unlabored.  L hip with heatlhy skin.  Actively wiggles toes with 4/5 strength.  Sens to LT intact at the L LE.  No lymphadenopathy.  No gross deformity of the LEs.  Pain with motion at the left hip.  Assessment/Plan: L hip femoral neck fracture - I explained the nature of the injury to the patient's son in detail.  We discussed treatment options including surgical management with hemi arthroplasty and palliative care.  He understands the risks and benefits of the alternative treatment options and would like to proceed with left hip hemiarthroplasty.  I'll schedule this surgery for tomorrow morning first case assuming  that she is cleared for surgery by cardiology.  D/w Drs. Regalado and Swintek.    The risks and benefits of the alternative treatment options have been discussed in detail.  The patient wishes to proceed with surgery and specifically understands risks of bleeding, infection, nerve damage, blood clots, need for additional surgery, amputation and death.   Wylene Simmer 10/09/15, 4:45 PM

## 2015-10-07 NOTE — H&P (Signed)
[facility] History and Physical                                                                                    Diane Santana, is a 81 y.o. female  MRN: [id number]   DOB - Dec 03, 1925  Admit Date - 10/07/2015  Outpatient Primary MD for the patient is Felix Pacini, DO  Referring Physician:  Dr. Fredderick Phenix  Chief Complaint:   Chief Complaint  Patient presents with  . Fall     HPI  Diane Santana  is a 81 y.o. female, on no medications, with dementia presents from home after a fall. Her son Gerlene Burdock, provides the history as the patient is demented. Normally the patient walks about home with no difficulty. But yesterday she seemed to be limping, favoring her left side. She needed significant assistance to get out of chairs. This morning while she was standing in the bathroom trying to wash her face she experienced an unwitnessed fall. Her son heard a thud and immediately came to find her.  The patient currently denies chest pain, recent illness, difficulty breathing. Per her son she has been feeling well recently, mostly independent with her ADLs, and eating well.  In the ER x-rays of her hip show a closed left femoral neck fracture. Labs are reassuring with the exception of platelets of 124. Dr. Victorino Dike was called for evaluation and is tentatively planning surgical repair in the morning on 11/25. Radiology has been called for cardiac clearance.  Review of systems is likely inaccurate as patient is demented Review of Systems  HENT: Negative.   Eyes: Negative.   Respiratory: Negative.   Cardiovascular: Negative.   Gastrointestinal: Negative.   Genitourinary: Negative.   Musculoskeletal: Positive for falls.  Skin: Negative.   Neurological: Positive for weakness.  Endo/Heme/Allergies: Negative.   Psychiatric/Behavioral: Negative.      Past Medical History  Past Medical History  Diagnosis Date  . Acute meniscal tear of knee     left knee  . Urgency of urination   . Nocturia   .  Dementia     Past Surgical History  Procedure Laterality Date  . Cataract extraction w/ intraocular lens  implant, bilateral    . Dilation and curettage of uterus  2004 (APPROX)  . Knee arthroscopy with lateral menisectomy Left 03/04/2013    Procedure: LEFT KNEE ARTHROSCOPY PARTIAL MEDIAL AND LATERAL   MENISCECTOMY;  Surgeon: Drucilla Schmidt, MD;  Location: [facility];  Service: Orthopedics;  Laterality: Left;      Social History Social History  Substance Use Topics  . Smoking status: Never Smoker   . Smokeless tobacco: Never Used  . Alcohol Use: No   lives at home with her son and other caretakers. Mostly independent with ADLs. Was ambulatory without assistance.  Family History Family History  Problem Relation Age of Onset  . Cancer Mother     Breast    Prior to Admission medications   Medication Sig Start Date End Date Taking? Authorizing Provider  acetaminophen (TYLENOL) 500 MG tablet Take 500 mg by mouth every 6 (six) hours as needed for mild pain.   Yes Historical Provider, MD  Misc. Devices (3-IN-1 BEDSIDE TOILET) MISC DME 3-in- 1 bedside toilet. DDX code: Z91.81, R32, F03.90, R15.9 07/08/15   Renee A Kuneff, DO    No Known Allergies  Physical Exam  Vitals  Blood pressure 128/68, pulse 99, temperature 98.5 F (36.9 C), temperature source Oral, resp. rate 16, weight 40.37 kg (89 lb), SpO2 93 %.   General:  Frail, thin, elderly female, pleasantly demented lying in bed.  Son at bedside. Laceration/skin tear on the left side of her face  Psych:  Awake, alert, will answer questions and follow commands. Appears pleasantly demented.  Neuro:   No F.N deficits, ALL C.Nerves Intact, Strength 5/5 all 4 extremities, Sensation intact all 4 extremities.  ENT:  Ears and Eyes appear Normal, Conjunctivae clear, PER. Moist oral mucosa without erythema or exudates.  Neck:  Supple, No lymphadenopathy appreciated  Respiratory:  Symmetrical chest wall movement,  Good air movement bilaterally, CTAB.  Cardiac:  Tachycardic, No Murmurs, no LE edema noted, no JVD.    Abdomen:  Decreased bowel sounds, thin, firm, Non tender, Non distended,  No masses appreciated  Skin:  No Cyanosis, Normal Skin Turgor, left facial skin tear  Extremities:  Able to move all 4.   no effusions.  Data Review  Wt Readings from Last 3 Encounters:  10/07/15 40.37 kg (89 lb)  06/21/15 40.37 kg (89 lb)  06/17/15 39.009 kg (86 lb)    CBC  Recent Labs Lab 10/07/15 1205  WBC 10.2  HGB 11.5*  HCT 33.5*  PLT 124*  MCV 96.3  MCH 33.0  MCHC 34.3  RDW 13.8    Chemistries   Recent Labs Lab 10/07/15 1227  NA 140  K 4.8  CL 106  CO2 28  GLUCOSE 99  BUN 21*  CREATININE 0.85  CALCIUM 9.3    PT/INR: pending  Urinalysis: Pending    Imaging results:   Ct Head Wo Contrast  10/07/2015  CLINICAL DATA:  Pain following fall EXAM: CT HEAD WITHOUT CONTRAST CT MAXILLOFACIAL WITHOUT CONTRAST CT CERVICAL SPINE WITHOUT CONTRAST TECHNIQUE: Multidetector CT imaging of the head, cervical spine, and maxillofacial structures were performed using the standard protocol without intravenous contrast. Multiplanar CT image reconstructions of the cervical spine and maxillofacial structures were also generated. COMPARISON:  Head CT June 17, 2015 FINDINGS: CT HEAD FINDINGS Mild diffuse atrophy is stable. There is no intracranial mass, hemorrhage, extra-axial fluid collection, or midline shift. There is small vessel disease throughout the centra semiovale bilaterally, stable. No new gray-white compartment lesions are identified. No acute infarct evident. A small calcification in the periphery of the mid left occipital lobe is stable in may represent a tiny granuloma. The bony calvarium appears intact. The mastoid air cells are clear. CT MAXILLOFACIAL FINDINGS There is no fracture or dislocation. The orbits appear symmetric and normal bilaterally. There are no demonstrable intraorbital  lesions. There is opacification of a superior anterior right ethmoid air cell. Other paranasal sinuses are clear. There is no air-fluid level. No bony destruction or expansion. The ostiomeatal unit complexes appear patent bilaterally. There is edema of the right inferior nasal turbinate with narrowing of the right naris. There is no nares obstruction. There is rightward deviation of the nasal septum. Salivary glands appear unremarkable. Atherosclerotic changes noted in both carotid arteries. No adenopathy is appreciable. There is arthropathy in both temporomandibular joints. CT CERVICAL SPINE FINDINGS There is no fracture. Minimal retrolisthesis of C5 on C6 is felt to be due to underlying spondylosis. There is no other spondylolisthesis.  Prevertebral soft tissues and predental space regions are normal. There is facet hypertrophy at multiple levels bilaterally. There is exit foraminal narrowing at multiple levels, most pronounced on the left at C3-4, on the left at C4-5, at C5-6 bilaterally, more severe on the right than on the left, and C6-7 bilaterally, more severe on the right than on the left. There is no frank disc extrusion or high-grade stenosis. There is calcification in each carotid artery. There are multiple nodular lesions in the thyroid consistent with multinodular goiter. IMPRESSION: CT head: Atrophy with supratentorial small vessel disease, stable. No acute infarct evident. No mass, hemorrhage, or extra-axial fluid collection. CT maxillofacial: No fracture or dislocation. No intraorbital lesions. Mild right ethmoid sinus disease. Other paranasal sinuses are clear. There is edema of the inferior right nasal turbinate with nares narrowing on the right. Rightward deviation nasal septum. Temporomandibular joint osteoarthritic change bilaterally. Calcification in each carotid artery. CT cervical spine: Multilevel osteoarthritic change. No fracture. Minimal spondylolisthesis at C5-6 is felt to be due to  underlying spondylosis. No other spondylolisthesis. Calcification noted in each carotid artery. Evidence of multinodular goiter. Consider further evaluation with thyroid ultrasound. If patient is clinically hyperthyroid, consider nuclear medicine thyroid uptake and scan. Electronically Signed   By: Bretta Bang III M.D.   On: 10/07/2015 13:37   Ct Cervical Spine Wo Contrast  10/07/2015  CLINICAL DATA:  Pain following fall EXAM: CT HEAD WITHOUT CONTRAST CT MAXILLOFACIAL WITHOUT CONTRAST CT CERVICAL SPINE WITHOUT CONTRAST TECHNIQUE: Multidetector CT imaging of the head, cervical spine, and maxillofacial structures were performed using the standard protocol without intravenous contrast. Multiplanar CT image reconstructions of the cervical spine and maxillofacial structures were also generated. COMPARISON:  Head CT June 17, 2015 FINDINGS: CT HEAD FINDINGS Mild diffuse atrophy is stable. There is no intracranial mass, hemorrhage, extra-axial fluid collection, or midline shift. There is small vessel disease throughout the centra semiovale bilaterally, stable. No new gray-white compartment lesions are identified. No acute infarct evident. A small calcification in the periphery of the mid left occipital lobe is stable in may represent a tiny granuloma. The bony calvarium appears intact. The mastoid air cells are clear. CT MAXILLOFACIAL FINDINGS There is no fracture or dislocation. The orbits appear symmetric and normal bilaterally. There are no demonstrable intraorbital lesions. There is opacification of a superior anterior right ethmoid air cell. Other paranasal sinuses are clear. There is no air-fluid level. No bony destruction or expansion. The ostiomeatal unit complexes appear patent bilaterally. There is edema of the right inferior nasal turbinate with narrowing of the right naris. There is no nares obstruction. There is rightward deviation of the nasal septum. Salivary glands appear unremarkable.  Atherosclerotic changes noted in both carotid arteries. No adenopathy is appreciable. There is arthropathy in both temporomandibular joints. CT CERVICAL SPINE FINDINGS There is no fracture. Minimal retrolisthesis of C5 on C6 is felt to be due to underlying spondylosis. There is no other spondylolisthesis. Prevertebral soft tissues and predental space regions are normal. There is facet hypertrophy at multiple levels bilaterally. There is exit foraminal narrowing at multiple levels, most pronounced on the left at C3-4, on the left at C4-5, at C5-6 bilaterally, more severe on the right than on the left, and C6-7 bilaterally, more severe on the right than on the left. There is no frank disc extrusion or high-grade stenosis. There is calcification in each carotid artery. There are multiple nodular lesions in the thyroid consistent with multinodular goiter. IMPRESSION: CT head: Atrophy with supratentorial small  vessel disease, stable. No acute infarct evident. No mass, hemorrhage, or extra-axial fluid collection. CT maxillofacial: No fracture or dislocation. No intraorbital lesions. Mild right ethmoid sinus disease. Other paranasal sinuses are clear. There is edema of the inferior right nasal turbinate with nares narrowing on the right. Rightward deviation nasal septum. Temporomandibular joint osteoarthritic change bilaterally. Calcification in each carotid artery. CT cervical spine: Multilevel osteoarthritic change. No fracture. Minimal spondylolisthesis at C5-6 is felt to be due to underlying spondylosis. No other spondylolisthesis. Calcification noted in each carotid artery. Evidence of multinodular goiter. Consider further evaluation with thyroid ultrasound. If patient is clinically hyperthyroid, consider nuclear medicine thyroid uptake and scan. Electronically Signed   By: Bretta Bang III M.D.   On: 10/07/2015 13:37   Dg Chest Port 1 View  10/07/2015  CLINICAL DATA:  Preop.  Hip fracture. EXAM: PORTABLE  CHEST 1 VIEW COMPARISON:  None. FINDINGS: Upper normal heart size. Clear lungs. No pneumothorax. No obvious bony deformity. Osteopenia. IMPRESSION: No active disease. Electronically Signed   By: Jolaine Click M.D.   On: 10/07/2015 14:36   Dg Hips Bilat With Pelvis Min 5 Views  10/07/2015  CLINICAL DATA:  Pain following fall EXAM: DG HIP (WITH OR WITHOUT PELVIS) 5+V BILAT COMPARISON:  None. FINDINGS: Frontal pelvis as well as frontal lateral views of each hip obtained. There is evidence of an impaction type injury at the subcapital femoral neck level on the left. There is no appreciable displacement of fracture fragments. There is no other fracture apparent. No dislocation. There is mild symmetric narrowing of both hip joints. Bones are osteoporotic. There is degenerative change in the visualized lumbar spine. IMPRESSION: Evidence of impaction type fracture in the subcapital femoral neck region on the left. No other evidence of fracture. No dislocation. Symmetric narrowing both hip joints. Bones osteoporotic. Electronically Signed   By: Bretta Bang III M.D.   On: 10/07/2015 13:51   Ct Maxillofacial Wo Cm  10/07/2015  CLINICAL DATA:  Pain following fall EXAM: CT HEAD WITHOUT CONTRAST CT MAXILLOFACIAL WITHOUT CONTRAST CT CERVICAL SPINE WITHOUT CONTRAST TECHNIQUE: Multidetector CT imaging of the head, cervical spine, and maxillofacial structures were performed using the standard protocol without intravenous contrast. Multiplanar CT image reconstructions of the cervical spine and maxillofacial structures were also generated. COMPARISON:  Head CT June 17, 2015 FINDINGS: CT HEAD FINDINGS Mild diffuse atrophy is stable. There is no intracranial mass, hemorrhage, extra-axial fluid collection, or midline shift. There is small vessel disease throughout the centra semiovale bilaterally, stable. No new gray-white compartment lesions are identified. No acute infarct evident. A small calcification in the periphery of  the mid left occipital lobe is stable in may represent a tiny granuloma. The bony calvarium appears intact. The mastoid air cells are clear. CT MAXILLOFACIAL FINDINGS There is no fracture or dislocation. The orbits appear symmetric and normal bilaterally. There are no demonstrable intraorbital lesions. There is opacification of a superior anterior right ethmoid air cell. Other paranasal sinuses are clear. There is no air-fluid level. No bony destruction or expansion. The ostiomeatal unit complexes appear patent bilaterally. There is edema of the right inferior nasal turbinate with narrowing of the right naris. There is no nares obstruction. There is rightward deviation of the nasal septum. Salivary glands appear unremarkable. Atherosclerotic changes noted in both carotid arteries. No adenopathy is appreciable. There is arthropathy in both temporomandibular joints. CT CERVICAL SPINE FINDINGS There is no fracture. Minimal retrolisthesis of C5 on C6 is felt to be due to underlying  spondylosis. There is no other spondylolisthesis. Prevertebral soft tissues and predental space regions are normal. There is facet hypertrophy at multiple levels bilaterally. There is exit foraminal narrowing at multiple levels, most pronounced on the left at C3-4, on the left at C4-5, at C5-6 bilaterally, more severe on the right than on the left, and C6-7 bilaterally, more severe on the right than on the left. There is no frank disc extrusion or high-grade stenosis. There is calcification in each carotid artery. There are multiple nodular lesions in the thyroid consistent with multinodular goiter. IMPRESSION: CT head: Atrophy with supratentorial small vessel disease, stable. No acute infarct evident. No mass, hemorrhage, or extra-axial fluid collection. CT maxillofacial: No fracture or dislocation. No intraorbital lesions. Mild right ethmoid sinus disease. Other paranasal sinuses are clear. There is edema of the inferior right nasal  turbinate with nares narrowing on the right. Rightward deviation nasal septum. Temporomandibular joint osteoarthritic change bilaterally. Calcification in each carotid artery. CT cervical spine: Multilevel osteoarthritic change. No fracture. Minimal spondylolisthesis at C5-6 is felt to be due to underlying spondylosis. No other spondylolisthesis. Calcification noted in each carotid artery. Evidence of multinodular goiter. Consider further evaluation with thyroid ultrasound. If patient is clinically hyperthyroid, consider nuclear medicine thyroid uptake and scan. Electronically Signed   By: Bretta Bang III M.D.   On: 10/07/2015 13:37    My personal review of EKG: NSR, No ST changes noted.   Assessment & Plan  Principal Problem:   Hip fracture (HCC) Active Problems:   Severe dementia   Urinary incontinence   Facial laceration   LBBB (left bundle branch block)   Thrombocytopenia (HCC)   Left Hip Fracture Uncertain if hip fracture caused the fall or vice versa. Dr. Victorino Dike to evaluate for possible repair on 11/25. Will make nothing by mouth after midnight.  Given left bundle branch block we have requested cardiology clear her for surgery. 2-D echo ordered.  Heparin ordered for DVT prophylaxis. Rest of the orders are per hip fracture order set.  Left bundle branch block with prolonged QT. Patient denies chest pain.  Her EKG of 06/17/15 also shows left bundle branch block. Per son she has never had a cardiology workup. We will cycle troponin.  Appreciate Dr. Junius Argyle evaluation and recommendations.  Severe dementia Patient will cared for by family. She is not on any medications at home.  Per her son she walks about the house normally and performs many of her own ADLs. She eats well.  Thrombocytopenia Appears chronic as it was present in August 2016.  No pharmacologic DVT prophylaxis for now as she is going for surgery tomorrow.  We will send a smear for review.  Facial  laceration Wound care consultation for recommendations.  Goiter Will check TSH, Free T4   Consultants Called:    Dr. Victorino Dike of orthopedic surgery, and Dr. Purvis Sheffield of cardiology  Family Communication:     Son, Richard at bedside  Code Status:    DNR  Condition:    Guarded.  Potential Disposition:   To SNF vs home depending on needs post op.  Time spent in minutes : [address]   [facility] [place],  [place] on 10/07/2015 at 4:05 PM Between 7am to 7pm - Pager - [phone] After 7pm go to [email or web address] - password [email or web address] And look for the night coverage person covering me after hours

## 2015-10-08 ENCOUNTER — Inpatient Hospital Stay (HOSPITAL_COMMUNITY): Payer: Medicare Other | Admitting: Certified Registered Nurse Anesthetist

## 2015-10-08 ENCOUNTER — Encounter (HOSPITAL_COMMUNITY): Payer: Self-pay | Admitting: Certified Registered Nurse Anesthetist

## 2015-10-08 ENCOUNTER — Inpatient Hospital Stay (HOSPITAL_COMMUNITY): Payer: Medicare Other

## 2015-10-08 ENCOUNTER — Encounter (HOSPITAL_COMMUNITY)
Admission: EM | Disposition: A | Discharge status: Skilled Nursing Facility | Payer: Self-pay | Source: Home / Self Care | Attending: Internal Medicine

## 2015-10-08 DIAGNOSIS — Z01810 Encounter for preprocedural cardiovascular examination: Secondary | ICD-10-CM

## 2015-10-08 DIAGNOSIS — E43 Unspecified severe protein-calorie malnutrition: Secondary | ICD-10-CM

## 2015-10-08 DIAGNOSIS — S0181XA Laceration without foreign body of other part of head, initial encounter: Secondary | ICD-10-CM

## 2015-10-08 DIAGNOSIS — S72002A Fracture of unspecified part of neck of left femur, initial encounter for closed fracture: Secondary | ICD-10-CM | POA: Diagnosis present

## 2015-10-08 HISTORY — PX: ANTERIOR APPROACH HEMI HIP ARTHROPLASTY: SHX6690

## 2015-10-08 LAB — CBC
HEMATOCRIT: 30.3 % — AB (ref 36.0–46.0)
Hemoglobin: 10.2 g/dL — ABNORMAL LOW (ref 12.0–15.0)
MCH: 32 pg (ref 26.0–34.0)
MCHC: 33.7 g/dL (ref 30.0–36.0)
MCV: 95 fL (ref 78.0–100.0)
PLATELETS: 103 10*3/uL — AB (ref 150–400)
RBC: 3.19 MIL/uL — AB (ref 3.87–5.11)
RDW: 13.6 % (ref 11.5–15.5)
WBC: 4.3 10*3/uL (ref 4.0–10.5)

## 2015-10-08 LAB — TROPONIN I: Troponin I: <0.03 ng/mL (ref <0.031)

## 2015-10-08 LAB — BASIC METABOLIC PANEL
ANION GAP: 8 (ref 5–15)
BUN: 23 mg/dL — ABNORMAL HIGH (ref 6–20)
CO2: 22 mmol/L (ref 22–32)
Calcium: 8.5 mg/dL — ABNORMAL LOW (ref 8.9–10.3)
Chloride: 107 mmol/L (ref 101–111)
Creatinine, Ser: 0.82 mg/dL (ref 0.44–1.00)
GLUCOSE: 96 mg/dL (ref 65–99)
POTASSIUM: 4.3 mmol/L (ref 3.5–5.1)
Sodium: 137 mmol/L (ref 135–145)

## 2015-10-08 SURGERY — HEMIARTHROPLASTY, HIP, DIRECT ANTERIOR APPROACH, FOR FRACTURE
Anesthesia: Monitor Anesthesia Care | Laterality: Left

## 2015-10-08 MED ORDER — FLEET ENEMA 7-19 GM/118ML RE ENEM: 1.0000 | ENEMA | Freq: Once | RECTAL | Status: DC | PRN

## 2015-10-08 MED ORDER — ALBUMIN HUMAN 5 % IV SOLN
INTRAVENOUS | Status: DC | PRN
  Administered 2015-10-08: 08:00:00 via INTRAVENOUS

## 2015-10-08 MED ORDER — SUCCINYLCHOLINE CHLORIDE 20 MG/ML IJ SOLN
INTRAMUSCULAR | Status: AC
  Filled 2015-10-08: qty 1

## 2015-10-08 MED ORDER — ONDANSETRON HCL 4 MG/2ML IJ SOLN: 4.0000 mg | Freq: Once | INTRAMUSCULAR | Status: DC | PRN

## 2015-10-08 MED ORDER — METOCLOPRAMIDE HCL 5 MG PO TABS: 5.0000 mg | ORAL_TABLET | Freq: Three times a day (TID) | ORAL | Status: DC | PRN

## 2015-10-08 MED ORDER — SENNA 8.6 MG PO TABS
1.0000 | ORAL_TABLET | Freq: Two times a day (BID) | ORAL | Status: DC
  Administered 2015-10-08 – 2015-10-10 (×5): 8.6 mg via ORAL
  Filled 2015-10-08 (×6): qty 1

## 2015-10-08 MED ORDER — SORBITOL 70 % SOLN: 30.0000 mL | Freq: Every day | Status: DC | PRN

## 2015-10-08 MED ORDER — METOCLOPRAMIDE HCL 5 MG/ML IJ SOLN: 5.0000 mg | Freq: Three times a day (TID) | INTRAMUSCULAR | Status: DC | PRN

## 2015-10-08 MED ORDER — ONDANSETRON HCL 4 MG/2ML IJ SOLN: 4.0000 mg | Freq: Four times a day (QID) | INTRAMUSCULAR | Status: DC | PRN

## 2015-10-08 MED ORDER — FAMOTIDINE 20 MG PO TABS
20.0000 mg | ORAL_TABLET | Freq: Two times a day (BID) | ORAL | Status: DC
Start: 2015-10-08 — End: 2015-10-11
  Administered 2015-10-08 – 2015-10-11 (×6): 20 mg via ORAL
  Filled 2015-10-08 (×7): qty 1

## 2015-10-08 MED ORDER — MIDAZOLAM HCL 2 MG/2ML IJ SOLN
INTRAMUSCULAR | Status: AC
  Filled 2015-10-08: qty 2

## 2015-10-08 MED ORDER — HYDROCODONE-ACETAMINOPHEN 5-325 MG PO TABS
1.0000 | ORAL_TABLET | Freq: Four times a day (QID) | ORAL | Status: DC | PRN
  Administered 2015-10-08: 1 via ORAL
  Filled 2015-10-08: qty 1

## 2015-10-08 MED ORDER — BUPIVACAINE-EPINEPHRINE (PF) 0.5% -1:200000 IJ SOLN
INTRAMUSCULAR | Status: AC
  Filled 2015-10-08: qty 30

## 2015-10-08 MED ORDER — FENTANYL CITRATE (PF) 100 MCG/2ML IJ SOLN: 25.0000 ug | INTRAMUSCULAR | Status: DC | PRN

## 2015-10-08 MED ORDER — CEFAZOLIN SODIUM-DEXTROSE 2-3 GM-% IV SOLR
2.0000 g | Freq: Four times a day (QID) | INTRAVENOUS | Status: AC
  Administered 2015-10-08 (×2): 2 g via INTRAVENOUS
  Filled 2015-10-08 (×2): qty 50

## 2015-10-08 MED ORDER — ONDANSETRON HCL 4 MG PO TABS: 4.0000 mg | ORAL_TABLET | Freq: Four times a day (QID) | ORAL | Status: DC | PRN

## 2015-10-08 MED ORDER — OXYCODONE HCL 5 MG PO TABS
5.0000 mg | ORAL_TABLET | Freq: Four times a day (QID) | ORAL | Status: DC | PRN
  Administered 2015-10-08: 5 mg via ORAL
  Filled 2015-10-08: qty 1

## 2015-10-08 MED ORDER — SODIUM CHLORIDE 0.9 % IV SOLN
INTRAVENOUS | Status: AC
  Administered 2015-10-08: 16:00:00 via INTRAVENOUS

## 2015-10-08 MED ORDER — KETOROLAC TROMETHAMINE 30 MG/ML IJ SOLN
INTRAMUSCULAR | Status: DC | PRN
  Administered 2015-10-08: 30 mg via INTRA_ARTICULAR

## 2015-10-08 MED ORDER — TRANEXAMIC ACID 1000 MG/10ML IV SOLN
1000.0000 mg | INTRAVENOUS | Status: AC
  Administered 2015-10-08: 1000 mg via INTRAVENOUS
  Filled 2015-10-08: qty 10

## 2015-10-08 MED ORDER — MENTHOL 3 MG MT LOZG: 1.0000 | LOZENGE | OROMUCOSAL | Status: DC | PRN

## 2015-10-08 MED ORDER — 0.9 % SODIUM CHLORIDE (POUR BTL) OPTIME
TOPICAL | Status: DC | PRN
  Administered 2015-10-08: 1000 mL

## 2015-10-08 MED ORDER — PHENOL 1.4 % MT LIQD: 1.0000 | OROMUCOSAL | Status: DC | PRN

## 2015-10-08 MED ORDER — ASPIRIN EC 325 MG PO TBEC
325.0000 mg | DELAYED_RELEASE_TABLET | Freq: Two times a day (BID) | ORAL | Status: DC
  Administered 2015-10-08 – 2015-10-11 (×6): 325 mg via ORAL
  Filled 2015-10-08 (×7): qty 1

## 2015-10-08 MED ORDER — ACETAMINOPHEN 325 MG PO TABS: 650.0000 mg | ORAL_TABLET | Freq: Four times a day (QID) | ORAL | Status: DC | PRN

## 2015-10-08 MED ORDER — EPHEDRINE SULFATE 50 MG/ML IJ SOLN
INTRAMUSCULAR | Status: AC
  Filled 2015-10-08: qty 1

## 2015-10-08 MED ORDER — LACTATED RINGERS IV SOLN
INTRAVENOUS | Status: DC | PRN
  Administered 2015-10-08: 07:00:00 via INTRAVENOUS

## 2015-10-08 MED ORDER — SODIUM CHLORIDE 0.9 % IJ SOLN
INTRAMUSCULAR | Status: DC | PRN
  Administered 2015-10-08: 30 mL

## 2015-10-08 MED ORDER — ACETAMINOPHEN 500 MG PO TABS
1000.0000 mg | ORAL_TABLET | Freq: Three times a day (TID) | ORAL | Status: DC
  Administered 2015-10-08 – 2015-10-10 (×7): 1000 mg via ORAL
  Filled 2015-10-08 (×6): qty 2

## 2015-10-08 MED ORDER — PROPOFOL 10 MG/ML IV BOLUS
INTRAVENOUS | Status: AC
  Filled 2015-10-08: qty 20

## 2015-10-08 MED ORDER — FENTANYL CITRATE (PF) 250 MCG/5ML IJ SOLN
INTRAMUSCULAR | Status: AC
  Filled 2015-10-08: qty 5

## 2015-10-08 MED ORDER — ACETAMINOPHEN 650 MG RE SUPP
650.0000 mg | Freq: Four times a day (QID) | RECTAL | Status: DC | PRN
Start: 2015-10-08 — End: 2015-10-08

## 2015-10-08 MED ORDER — PHENYLEPHRINE HCL 10 MG/ML IJ SOLN
10.0000 mg | INTRAVENOUS | Status: DC | PRN
  Administered 2015-10-08: 20 ug/min via INTRAVENOUS

## 2015-10-08 MED ORDER — BOOST / RESOURCE BREEZE PO LIQD
1.0000 | Freq: Three times a day (TID) | ORAL | Status: DC
Start: 2015-10-08 — End: 2015-10-11
  Administered 2015-10-08 – 2015-10-11 (×10): 1 via ORAL

## 2015-10-08 MED ORDER — ROCURONIUM BROMIDE 50 MG/5ML IV SOLN
INTRAVENOUS | Status: AC
  Filled 2015-10-08: qty 1

## 2015-10-08 MED ORDER — SODIUM CHLORIDE 0.9 % IV SOLN: INTRAVENOUS | Status: DC | PRN

## 2015-10-08 MED ORDER — BUPIVACAINE-EPINEPHRINE (PF) 0.5% -1:200000 IJ SOLN
INTRAMUSCULAR | Status: DC | PRN
  Administered 2015-10-08: 30 mL

## 2015-10-08 MED ORDER — PROPOFOL 500 MG/50ML IV EMUL
INTRAVENOUS | Status: DC | PRN
  Administered 2015-10-08: 50 ug/kg/min via INTRAVENOUS

## 2015-10-08 MED ORDER — DOCUSATE SODIUM 100 MG PO CAPS: 100.0000 mg | ORAL_CAPSULE | Freq: Two times a day (BID) | ORAL | Status: DC

## 2015-10-08 MED ORDER — BACITRACIN-NEOMYCIN-POLYMYXIN OINTMENT TUBE
TOPICAL_OINTMENT | Freq: Two times a day (BID) | CUTANEOUS | Status: DC
  Administered 2015-10-08 – 2015-10-10 (×5): via TOPICAL
  Filled 2015-10-08 (×2): qty 1
  Filled 2015-10-08: qty 15

## 2015-10-08 MED ORDER — LIDOCAINE HCL (CARDIAC) 20 MG/ML IV SOLN
INTRAVENOUS | Status: DC | PRN
  Administered 2015-10-08: 50 mg via INTRAVENOUS

## 2015-10-08 MED ORDER — STERILE WATER FOR INJECTION IJ SOLN
INTRAMUSCULAR | Status: AC
  Filled 2015-10-08: qty 10

## 2015-10-08 SURGICAL SUPPLY — 54 items
ADH SKN CLS APL DERMABOND .7 (GAUZE/BANDAGES/DRESSINGS) ×2
BLADE SAW SGTL 18X1.27X75 (BLADE) ×2 IMPLANT
BLADE SAW SGTL 18X1.27X75MM (BLADE) ×1
BLADE SURG ROTATE 9660 (MISCELLANEOUS) ×3 IMPLANT
CAPT HIP HEMI 2 ×3 IMPLANT
CHLORAPREP W/TINT 26ML (MISCELLANEOUS) ×3 IMPLANT
COVER SURGICAL LIGHT HANDLE (MISCELLANEOUS) ×3 IMPLANT
DERMABOND ADVANCED (GAUZE/BANDAGES/DRESSINGS) ×4
DERMABOND ADVANCED .7 DNX12 (GAUZE/BANDAGES/DRESSINGS) ×2 IMPLANT
DRAPE C-ARM 42X72 X-RAY (DRAPES) ×3 IMPLANT
DRAPE IMP U-DRAPE 54X76 (DRAPES) ×6 IMPLANT
DRAPE STERI IOBAN 125X83 (DRAPES) ×3 IMPLANT
DRAPE U-SHAPE 47X51 STRL (DRAPES) ×9 IMPLANT
DRSG AQUACEL AG ADV 3.5X10 (GAUZE/BANDAGES/DRESSINGS) ×3 IMPLANT
ELECT BLADE 4.0 EZ CLEAN MEGAD (MISCELLANEOUS) ×3
ELECT REM PT RETURN 9FT ADLT (ELECTROSURGICAL) ×3
ELECTRODE BLDE 4.0 EZ CLN MEGD (MISCELLANEOUS) ×1 IMPLANT
ELECTRODE REM PT RTRN 9FT ADLT (ELECTROSURGICAL) ×1 IMPLANT
EVACUATOR 1/8 PVC DRAIN (DRAIN) IMPLANT
GLOVE BIO SURGEON STRL SZ8.5 (GLOVE) ×6 IMPLANT
GLOVE BIOGEL PI IND STRL 8.5 (GLOVE) ×1 IMPLANT
GLOVE BIOGEL PI INDICATOR 8.5 (GLOVE) ×2
GOWN STRL REUS W/ TWL LRG LVL3 (GOWN DISPOSABLE) ×2 IMPLANT
GOWN STRL REUS W/TWL 2XL LVL3 (GOWN DISPOSABLE) ×3 IMPLANT
GOWN STRL REUS W/TWL LRG LVL3 (GOWN DISPOSABLE) ×6
HANDPIECE INTERPULSE COAX TIP (DISPOSABLE) ×3
HOOD PEEL AWAY FACE SHEILD DIS (HOOD) ×6 IMPLANT
KIT BASIN OR (CUSTOM PROCEDURE TRAY) ×3 IMPLANT
KIT ROOM TURNOVER OR (KITS) ×3 IMPLANT
MANIFOLD NEPTUNE II (INSTRUMENTS) ×3 IMPLANT
MARKER SKIN DUAL TIP RULER LAB (MISCELLANEOUS) ×3 IMPLANT
NEEDLE SPNL 18GX3.5 QUINCKE PK (NEEDLE) ×3 IMPLANT
NS IRRIG 1000ML POUR BTL (IV SOLUTION) ×3 IMPLANT
PACK TOTAL JOINT (CUSTOM PROCEDURE TRAY) ×3 IMPLANT
PACK UNIVERSAL I (CUSTOM PROCEDURE TRAY) ×3 IMPLANT
PAD ARMBOARD 7.5X6 YLW CONV (MISCELLANEOUS) ×6 IMPLANT
SEALER BIPOLAR AQUA 6.0 (INSTRUMENTS) ×3 IMPLANT
SET HNDPC FAN SPRY TIP SCT (DISPOSABLE) ×1 IMPLANT
SUCTION FRAZIER TIP 10 FR DISP (SUCTIONS) ×3 IMPLANT
SUT ETHIBOND NAB CT1 #1 30IN (SUTURE) ×6 IMPLANT
SUT MNCRL AB 3-0 PS2 18 (SUTURE) ×3 IMPLANT
SUT MNCRL AB 4-0 PS2 18 (SUTURE) ×3 IMPLANT
SUT MON AB 2-0 CT1 36 (SUTURE) ×3 IMPLANT
SUT VIC AB 1 CT1 27 (SUTURE) ×3
SUT VIC AB 1 CT1 27XBRD ANBCTR (SUTURE) ×1 IMPLANT
SUT VIC AB 2-0 CT1 27 (SUTURE) ×3
SUT VIC AB 2-0 CT1 TAPERPNT 27 (SUTURE) ×1 IMPLANT
SUT VLOC 180 0 24IN GS25 (SUTURE) ×3 IMPLANT
SYR 50ML LL SCALE MARK (SYRINGE) ×3 IMPLANT
TOWEL OR 17X24 6PK STRL BLUE (TOWEL DISPOSABLE) ×3 IMPLANT
TOWEL OR 17X26 10 PK STRL BLUE (TOWEL DISPOSABLE) ×3 IMPLANT
TRAY FOLEY CATH 16FR SILVER (SET/KITS/TRAYS/PACK) IMPLANT
WATER STERILE IRR 1000ML POUR (IV SOLUTION) ×9 IMPLANT
YANKAUER SUCT BULB TIP NO VENT (SUCTIONS) ×3 IMPLANT

## 2015-10-08 NOTE — Anesthesia Preprocedure Evaluation (Signed)
Anesthesia Evaluation  Patient identified by MRN, date of birth, ID band Patient awake    Reviewed: Allergy & Precautions, NPO status , Patient's Chart, lab work & pertinent test results  Airway Mallampati: II  TM Distance: >3 FB  Positive for:  Tracheal deviation   Dental  (+) Teeth Intact   Pulmonary    breath sounds clear to auscultation       Cardiovascular  Rhythm:Regular Rate:Normal     Neuro/Psych    GI/Hepatic   Endo/Other    Renal/GU      Musculoskeletal   Abdominal   Peds  Hematology   Anesthesia Other Findings Not following commands opens eys and moving  extremities  Reproductive/Obstetrics                             Anesthesia Physical Anesthesia Plan  ASA: III  Anesthesia Plan: MAC and Spinal   Post-op Pain Management: MAC Combined w/ Regional for Post-op pain   Induction: Intravenous  Airway Management Planned: Natural Airway and Simple Face Mask  Additional Equipment:   Intra-op Plan:   Post-operative Plan:   Informed Consent: I have reviewed the patients History and Physical, chart, labs and discussed the procedure including the risks, benefits and alternatives for the proposed anesthesia with the patient or authorized representative who has indicated his/her understanding and acceptance.     Plan Discussed with: CRNA and Anesthesiologist  Anesthesia Plan Comments:         Anesthesia Quick Evaluation

## 2015-10-08 NOTE — Anesthesia Procedure Notes (Signed)
Spinal  Start time: 10/08/2015 7:40 AM End time: 10/08/2015 7:42 AM Staffing Performed by: anesthesiologist  Preanesthetic Checklist Completed: patient identified, site marked, surgical consent, pre-op evaluation, timeout performed, IV checked, risks and benefits discussed and monitors and equipment checked Spinal Block Patient position: left lateral decubitus Prep: Betadine and site prepped and draped Patient monitoring: heart rate, cardiac monitor, continuous pulse ox and blood pressure Approach: left paramedian Location: L3-4 Injection technique: single-shot Needle Needle type: Tuohy  Needle gauge: 22 G Needle length: 9 cm Needle insertion depth: 4 cm Assessment Sensory level: T8 Additional Notes 10 mg 0.75% bupivacaine injected easily.

## 2015-10-08 NOTE — H&P (View-Only) (Signed)
Reason for Consult:  Left hip pain Referring Physician: Dr. [name] is an 81 y.o. female.  HPI:  81 y/o female with PMH of dementia fell this morning and was brought to the ER via EMS.  She lives with her son.  He says that yesterday she was not walking and c/o pain.  This morning he heard her fall and says she was unable to get back up.  She did not have a fall prior to this morning that he knows of.  She c/o pain in the left hip that is worse with motion and better with rest.  No h/o previous left hip fracture or surgery.  She takes no meds.  Son and daughter are both [place].  Past Medical History  Diagnosis Date  . Acute meniscal tear of knee     left knee  . Urgency of urination   . Nocturia   . Dementia     Past Surgical History  Procedure Laterality Date  . Cataract extraction w/ intraocular lens  implant, bilateral    . Dilation and curettage of uterus  2004 (APPROX)  . Knee arthroscopy with lateral menisectomy Left 03/04/2013    Procedure: LEFT KNEE ARTHROSCOPY PARTIAL MEDIAL AND LATERAL   MENISCECTOMY;  Surgeon: Magnus Sinning, MD;  Location: [facility];  Service: Orthopedics;  Laterality: Left;    Family History  Problem Relation Age of Onset  . Cancer Mother     Breast    Social History:  reports that she has never smoked. She has never used smokeless tobacco. She reports that she does not drink alcohol or use illicit drugs.  Allergies: No Known Allergies  Medications: I have reviewed the patient's current medications.  Results for orders placed or performed during the hospital encounter of 10/07/15 (from the past 48 hour(s))  CBC     Status: Abnormal   Collection Time: 10/07/15 12:05 PM  Result Value Ref Range   WBC 10.2 4.0 - 10.5 K/uL   RBC 3.48 (L) 3.87 - 5.11 MIL/uL   Hemoglobin 11.5 (L) 12.0 - 15.0 g/dL   HCT 33.5 (L) 36.0 - 46.0 %   MCV 96.3 78.0 - 100.0 fL   MCH 33.0 26.0 - 34.0 pg   MCHC 34.3 30.0 - 36.0 g/dL   RDW 13.8  11.5 - 15.5 %   Platelets 124 (L) 150 - 400 K/uL  Basic metabolic panel     Status: Abnormal   Collection Time: 10/07/15 12:27 PM  Result Value Ref Range   Sodium 140 135 - 145 mmol/L   Potassium 4.8 3.5 - 5.1 mmol/L   Chloride 106 101 - 111 mmol/L   CO2 28 22 - 32 mmol/L   Glucose, Bld 99 65 - 99 mg/dL   BUN 21 (H) 6 - 20 mg/dL   Creatinine, Ser 0.85 0.44 - 1.00 mg/dL   Calcium 9.3 8.9 - 10.3 mg/dL   GFR calc non Af Amer 60 (L) >60 mL/min   GFR calc Af Amer >60 >60 mL/min    Comment: (NOTE) The eGFR has been calculated using the CKD EPI equation. This calculation has not been validated in all clinical situations. eGFR's persistently <60 mL/min signify possible Chronic Kidney Disease.    Anion gap 6 5 - 15  I-stat troponin, ED     Status: None   Collection Time: 10/07/15 12:35 PM  Result Value Ref Range   Troponin i, poc 0.00 0.00 - 0.08 ng/mL  Comment 3            Comment: Due to the release kinetics of cTnI, a negative result within the first hours of the onset of symptoms does not rule out myocardial infarction with certainty. If myocardial infarction is still suspected, repeat the test at appropriate intervals.     Ct Head Wo Contrast  10/07/2015  CLINICAL DATA:  Pain following fall EXAM: CT HEAD WITHOUT CONTRAST CT MAXILLOFACIAL WITHOUT CONTRAST CT CERVICAL SPINE WITHOUT CONTRAST TECHNIQUE: Multidetector CT imaging of the head, cervical spine, and maxillofacial structures were performed using the standard protocol without intravenous contrast. Multiplanar CT image reconstructions of the cervical spine and maxillofacial structures were also generated. COMPARISON:  Head CT June 17, 2015 FINDINGS: CT HEAD FINDINGS Mild diffuse atrophy is stable. There is no intracranial mass, hemorrhage, extra-axial fluid collection, or midline shift. There is small vessel disease throughout the centra semiovale bilaterally, stable. No new gray-white compartment lesions are identified. No  acute infarct evident. A small calcification in the periphery of the mid left occipital lobe is stable in may represent a tiny granuloma. The bony calvarium appears intact. The mastoid air cells are clear. CT MAXILLOFACIAL FINDINGS There is no fracture or dislocation. The orbits appear symmetric and normal bilaterally. There are no demonstrable intraorbital lesions. There is opacification of a superior anterior right ethmoid air cell. Other paranasal sinuses are clear. There is no air-fluid level. No bony destruction or expansion. The ostiomeatal unit complexes appear patent bilaterally. There is edema of the right inferior nasal turbinate with narrowing of the right naris. There is no nares obstruction. There is rightward deviation of the nasal septum. Salivary glands appear unremarkable. Atherosclerotic changes noted in both carotid arteries. No adenopathy is appreciable. There is arthropathy in both temporomandibular joints. CT CERVICAL SPINE FINDINGS There is no fracture. Minimal retrolisthesis of C5 on C6 is felt to be due to underlying spondylosis. There is no other spondylolisthesis. Prevertebral soft tissues and predental space regions are normal. There is facet hypertrophy at multiple levels bilaterally. There is exit foraminal narrowing at multiple levels, most pronounced on the left at C3-4, on the left at C4-5, at C5-6 bilaterally, more severe on the right than on the left, and C6-7 bilaterally, more severe on the right than on the left. There is no frank disc extrusion or high-grade stenosis. There is calcification in each carotid artery. There are multiple nodular lesions in the thyroid consistent with multinodular goiter. IMPRESSION: CT head: Atrophy with supratentorial small vessel disease, stable. No acute infarct evident. No mass, hemorrhage, or extra-axial fluid collection. CT maxillofacial: No fracture or dislocation. No intraorbital lesions. Mild right ethmoid sinus disease. Other paranasal  sinuses are clear. There is edema of the inferior right nasal turbinate with nares narrowing on the right. Rightward deviation nasal septum. Temporomandibular joint osteoarthritic change bilaterally. Calcification in each carotid artery. CT cervical spine: Multilevel osteoarthritic change. No fracture. Minimal spondylolisthesis at C5-6 is felt to be due to underlying spondylosis. No other spondylolisthesis. Calcification noted in each carotid artery. Evidence of multinodular goiter. Consider further evaluation with thyroid ultrasound. If patient is clinically hyperthyroid, consider nuclear medicine thyroid uptake and scan. Electronically Signed   By: Lowella Grip III M.D.   On: 10/07/2015 13:37   Ct Cervical Spine Wo Contrast  10/07/2015  CLINICAL DATA:  Pain following fall EXAM: CT HEAD WITHOUT CONTRAST CT MAXILLOFACIAL WITHOUT CONTRAST CT CERVICAL SPINE WITHOUT CONTRAST TECHNIQUE: Multidetector CT imaging of the head, cervical spine, and maxillofacial structures  were performed using the standard protocol without intravenous contrast. Multiplanar CT image reconstructions of the cervical spine and maxillofacial structures were also generated. COMPARISON:  Head CT June 17, 2015 FINDINGS: CT HEAD FINDINGS Mild diffuse atrophy is stable. There is no intracranial mass, hemorrhage, extra-axial fluid collection, or midline shift. There is small vessel disease throughout the centra semiovale bilaterally, stable. No new gray-white compartment lesions are identified. No acute infarct evident. A small calcification in the periphery of the mid left occipital lobe is stable in may represent a tiny granuloma. The bony calvarium appears intact. The mastoid air cells are clear. CT MAXILLOFACIAL FINDINGS There is no fracture or dislocation. The orbits appear symmetric and normal bilaterally. There are no demonstrable intraorbital lesions. There is opacification of a superior anterior right ethmoid air cell. Other  paranasal sinuses are clear. There is no air-fluid level. No bony destruction or expansion. The ostiomeatal unit complexes appear patent bilaterally. There is edema of the right inferior nasal turbinate with narrowing of the right naris. There is no nares obstruction. There is rightward deviation of the nasal septum. Salivary glands appear unremarkable. Atherosclerotic changes noted in both carotid arteries. No adenopathy is appreciable. There is arthropathy in both temporomandibular joints. CT CERVICAL SPINE FINDINGS There is no fracture. Minimal retrolisthesis of C5 on C6 is felt to be due to underlying spondylosis. There is no other spondylolisthesis. Prevertebral soft tissues and predental space regions are normal. There is facet hypertrophy at multiple levels bilaterally. There is exit foraminal narrowing at multiple levels, most pronounced on the left at C3-4, on the left at C4-5, at C5-6 bilaterally, more severe on the right than on the left, and C6-7 bilaterally, more severe on the right than on the left. There is no frank disc extrusion or high-grade stenosis. There is calcification in each carotid artery. There are multiple nodular lesions in the thyroid consistent with multinodular goiter. IMPRESSION: CT head: Atrophy with supratentorial small vessel disease, stable. No acute infarct evident. No mass, hemorrhage, or extra-axial fluid collection. CT maxillofacial: No fracture or dislocation. No intraorbital lesions. Mild right ethmoid sinus disease. Other paranasal sinuses are clear. There is edema of the inferior right nasal turbinate with nares narrowing on the right. Rightward deviation nasal septum. Temporomandibular joint osteoarthritic change bilaterally. Calcification in each carotid artery. CT cervical spine: Multilevel osteoarthritic change. No fracture. Minimal spondylolisthesis at C5-6 is felt to be due to underlying spondylosis. No other spondylolisthesis. Calcification noted in each carotid  artery. Evidence of multinodular goiter. Consider further evaluation with thyroid ultrasound. If patient is clinically hyperthyroid, consider nuclear medicine thyroid uptake and scan. Electronically Signed   By: Lowella Grip III M.D.   On: 10/07/2015 13:37   Dg Chest Port 1 View  10/07/2015  CLINICAL DATA:  Preop.  Hip fracture. EXAM: PORTABLE CHEST 1 VIEW COMPARISON:  None. FINDINGS: Upper normal heart size. Clear lungs. No pneumothorax. No obvious bony deformity. Osteopenia. IMPRESSION: No active disease. Electronically Signed   By: Marybelle Killings M.D.   On: 10/07/2015 14:36   Dg Hips Bilat With Pelvis Min 5 Views  10/07/2015  CLINICAL DATA:  Pain following fall EXAM: DG HIP (WITH OR WITHOUT PELVIS) 5+V BILAT COMPARISON:  None. FINDINGS: Frontal pelvis as well as frontal lateral views of each hip obtained. There is evidence of an impaction type injury at the subcapital femoral neck level on the left. There is no appreciable displacement of fracture fragments. There is no other fracture apparent. No dislocation. There is mild symmetric  narrowing of both hip joints. Bones are osteoporotic. There is degenerative change in the visualized lumbar spine. IMPRESSION: Evidence of impaction type fracture in the subcapital femoral neck region on the left. No other evidence of fracture. No dislocation. Symmetric narrowing both hip joints. Bones osteoporotic. Electronically Signed   By: Lowella Grip III M.D.   On: 10/07/2015 13:51   Ct Maxillofacial Wo Cm  10/07/2015  CLINICAL DATA:  Pain following fall EXAM: CT HEAD WITHOUT CONTRAST CT MAXILLOFACIAL WITHOUT CONTRAST CT CERVICAL SPINE WITHOUT CONTRAST TECHNIQUE: Multidetector CT imaging of the head, cervical spine, and maxillofacial structures were performed using the standard protocol without intravenous contrast. Multiplanar CT image reconstructions of the cervical spine and maxillofacial structures were also generated. COMPARISON:  Head CT June 17, 2015  FINDINGS: CT HEAD FINDINGS Mild diffuse atrophy is stable. There is no intracranial mass, hemorrhage, extra-axial fluid collection, or midline shift. There is small vessel disease throughout the centra semiovale bilaterally, stable. No new gray-white compartment lesions are identified. No acute infarct evident. A small calcification in the periphery of the mid left occipital lobe is stable in may represent a tiny granuloma. The bony calvarium appears intact. The mastoid air cells are clear. CT MAXILLOFACIAL FINDINGS There is no fracture or dislocation. The orbits appear symmetric and normal bilaterally. There are no demonstrable intraorbital lesions. There is opacification of a superior anterior right ethmoid air cell. Other paranasal sinuses are clear. There is no air-fluid level. No bony destruction or expansion. The ostiomeatal unit complexes appear patent bilaterally. There is edema of the right inferior nasal turbinate with narrowing of the right naris. There is no nares obstruction. There is rightward deviation of the nasal septum. Salivary glands appear unremarkable. Atherosclerotic changes noted in both carotid arteries. No adenopathy is appreciable. There is arthropathy in both temporomandibular joints. CT CERVICAL SPINE FINDINGS There is no fracture. Minimal retrolisthesis of C5 on C6 is felt to be due to underlying spondylosis. There is no other spondylolisthesis. Prevertebral soft tissues and predental space regions are normal. There is facet hypertrophy at multiple levels bilaterally. There is exit foraminal narrowing at multiple levels, most pronounced on the left at C3-4, on the left at C4-5, at C5-6 bilaterally, more severe on the right than on the left, and C6-7 bilaterally, more severe on the right than on the left. There is no frank disc extrusion or high-grade stenosis. There is calcification in each carotid artery. There are multiple nodular lesions in the thyroid consistent with multinodular  goiter. IMPRESSION: CT head: Atrophy with supratentorial small vessel disease, stable. No acute infarct evident. No mass, hemorrhage, or extra-axial fluid collection. CT maxillofacial: No fracture or dislocation. No intraorbital lesions. Mild right ethmoid sinus disease. Other paranasal sinuses are clear. There is edema of the inferior right nasal turbinate with nares narrowing on the right. Rightward deviation nasal septum. Temporomandibular joint osteoarthritic change bilaterally. Calcification in each carotid artery. CT cervical spine: Multilevel osteoarthritic change. No fracture. Minimal spondylolisthesis at C5-6 is felt to be due to underlying spondylosis. No other spondylolisthesis. Calcification noted in each carotid artery. Evidence of multinodular goiter. Consider further evaluation with thyroid ultrasound. If patient is clinically hyperthyroid, consider nuclear medicine thyroid uptake and scan. Electronically Signed   By: Lowella Grip III M.D.   On: 10/07/2015 13:37    ROS:  No recent f/c/n/v/wt loss.  + inability to walk since yesterday PE:  Blood pressure 119/79, pulse 104, temperature 98.5 F (36.9 C), temperature source Oral, resp. rate 18, weight 40.37  kg (89 lb), SpO2 92 %. Elderly cachectic female in nad.  Variably alert and somnolent.  EOMI.  Oriented to person.  Resp unlabored.  L hip with heatlhy skin.  Actively wiggles toes with 4/5 strength.  Sens to LT intact at the L LE.  No lymphadenopathy.  No gross deformity of the LEs.  Pain with motion at the left hip.  Assessment/Plan: L hip femoral neck fracture - I explained the nature of the injury to the patient's son in detail.  We discussed treatment options including surgical management with hemi arthroplasty and palliative care.  He understands the risks and benefits of the alternative treatment options and would like to proceed with left hip hemiarthroplasty.  I'll schedule this surgery for tomorrow morning first case assuming  that she is cleared for surgery by cardiology.  D/w Drs. Regalado and Swintek.    The risks and benefits of the alternative treatment options have been discussed in detail.  The patient wishes to proceed with surgery and specifically understands risks of bleeding, infection, nerve damage, blood clots, need for additional surgery, amputation and death.   Diane Santana 10/09/15, 4:45 PM

## 2015-10-08 NOTE — Consult Note (Signed)
WOC wound consult note Reason for Consult: facial wound S/P fall at home.   Wound type: skin tear Pressure Ulcer POA:No Measurement:3cm x 0.2cm x 0.1cm  Wound ZOX:WRUEbed:pink, moist, skin flap present.  I am not able to completely re approximate the skin flap to steristrip into place.  Drainage (amount, consistency, odor) minimal serosanguinous  Periwound: intact  Dressing procedure/placement/frequency: Apply antibiotic ointment BID, cover with soft silicone foam dressing.  Will be gentle for removal from her thin skin.  I have explained rationale for care to the family.  If s/s of infection after DC patient to follow up with primary care MD.  Family verbalized understanding of same.  Discussed POC with patient and bedside nurse.  Re consult if needed, will not follow at this time. Thanks  Solace Manwarren [employer] RN, CWOCN [phone])

## 2015-10-08 NOTE — Progress Notes (Signed)
PATIENT DETAILS Name: Diane Santana Age: 81 y.o. Sex: female Date of Birth: 1926/05/01 Admit Date: 10/07/2015 Admitting Physician Alba CoryBelkys A Regalado, MD ZOX:[id number]:Renee Claiborne BillingsKuneff, DO  Subjective: Slightly sleepy/drowsy-had just come back from the operating room when I saw her earlier this afternoon.  Assessment/Plan: Principal Problem: Left  Hip fracture: Suspect from a mechanical fall. Seen by orthopedics, underwent left hip hemiarthroplasty on 11/25. Recommendations from orthopedics weightbearing as tolerated with a walker, aspirin 325 mg twice daily for DVT prophylaxis. Await physical therapy evaluation, suspect will require SNF on discharge.  Active Problems: Dementia: Has 24/7 care at home-not able to assess mental status currently. At risk for delirium-minimize narcotics status sedatives as much as possible  Prolonged QTC: Likely not reliable-in a setting of left bundle branch block. Monitor  LBBB: Chronic issue-both cardiology note-this was seen on a prior EKG on 06/17/15  Chronic systolic heart failure (EF by echo 40-50% on 11/25): No regional wall motion abnormalities, clinically compensated-suspect given advanced age/dementia no further workup required.   Small left facial skin tear: Appreciate wound care consultation-follow  Protein-calorie malnutrition, severe: Continue supplements, appreciate nutrition evaluation  Disposition: Remain inpatient-may require SNF on discharge  Antimicrobial agents  See below  Anti-infectives    Start     Dose/Rate Route Frequency Ordered Stop   10/08/15 1300  ceFAZolin (ANCEF) IVPB 2 g/50 mL premix     2 g 100 mL/hr over 30 Minutes Intravenous Every 6 hours 10/08/15 1114 10/09/15 0059   10/08/15 0630  ceFAZolin (ANCEF) IVPB 1 g/50 mL premix  Status:  Discontinued     1 g 100 mL/hr over 30 Minutes Intravenous To Surgery 10/07/15 1647 10/07/15 1722   10/08/15 0600  ceFAZolin (ANCEF) IVPB 2 g/50 mL premix     2 g 100 mL/hr  over 30 Minutes Intravenous On call to O.R. 10/07/15 1716 10/08/15 0750      DVT Prophylaxis: ASA/SCD  Code Status: DNR  Family Communication Son/Daughter at bedside  Procedures: 11/25>> left hip hemiarthroplasty  CONSULTS:  cardiology and orthopedic surgery  Time spent 30 minutes-Greater than 50% of this time was spent in counseling, explanation of diagnosis, planning of further management, and coordination of care.  MEDICATIONS: Scheduled Meds: . aspirin EC  325 mg Oral BID PC  .  ceFAZolin (ANCEF) IV  2 g Intravenous Q6H  . docusate sodium  100 mg Oral BID  . famotidine  20 mg Oral BID  . feeding supplement  1 Container Oral TID BM  . neomycin-bacitracin-polymyxin   Topical BID  . senna  1 tablet Oral BID  . Tdap  0.5 mL Intramuscular Once   Continuous Infusions: . sodium chloride     PRN Meds:.acetaminophen **OR** acetaminophen, HYDROcodone-acetaminophen, menthol-cetylpyridinium **OR** phenol, metoCLOPramide **OR** metoCLOPramide (REGLAN) injection, morphine injection, ondansetron **OR** ondansetron (ZOFRAN) IV, sodium phosphate, sorbitol    PHYSICAL EXAM: Vital signs in last 24 hours: Filed Vitals:   10/08/15 1009 10/08/15 1024 10/08/15 1028 10/08/15 1117  BP: 116/63 124/57  118/57  Pulse: 90 93  84  Temp:   98 F (36.7 C)   TempSrc:      Resp: 19 16  16   Weight:      SpO2: 100% 97%  99%    Weight change:  Filed Weights   10/07/15 1115  Weight: 40.37 kg (89 lb)   Body mass index is 17.38 kg/(m^2).   Gen Exam: Sleepy-but easily of arousable,  confused-but clear speech.  Neck: Supple, No JVD.   Chest: B/L Clear.   CVS: S1 S2 Regular, no murmurs.  Abdomen: soft, BS +, non tender, non distended.  Extremities: no edema, lower extremities warm to touch. Neurologic: Non Focal.   Skin: No Rash.   Wounds: N/A.    Intake/Output from previous day:  Intake/Output Summary (Last 24 hours) at 10/08/15 1549 Last data filed at 10/08/15 1028  Gross per 24  hour  Intake 889.17 ml  Output   1450 ml  Net -560.83 ml     LAB RESULTS: CBC  Recent Labs Lab 10/07/15 1205 10/08/15 0325  WBC 10.2 4.3  HGB 11.5* 10.2*  HCT 33.5* 30.3*  PLT 124* 103*  MCV 96.3 95.0  MCH 33.0 32.0  MCHC 34.3 33.7  RDW 13.8 13.6    Chemistries   Recent Labs Lab 10/07/15 1227 10/08/15 0325  NA 140 137  K 4.8 4.3  CL 106 107  CO2 28 22  GLUCOSE 99 96  BUN 21* 23*  CREATININE 0.85 0.82  CALCIUM 9.3 8.5*    CBG: No results for input(s): GLUCAP in the last 168 hours.  GFR Estimated Creatinine Clearance: 30.8 mL/min (by C-G formula based on Cr of 0.82).  Coagulation profile  Recent Labs Lab 10/07/15 1639  INR 1.09    Cardiac Enzymes  Recent Labs Lab 10/07/15 2115 10/08/15 0325 10/08/15 1220  TROPONINI <0.03 <0.03 <0.03    Invalid input(s): POCBNP No results for input(s): DDIMER in the last 72 hours. No results for input(s): HGBA1C in the last 72 hours. No results for input(s): CHOL, HDL, LDLCALC, TRIG, CHOLHDL, LDLDIRECT in the last 72 hours.  Recent Labs  10/07/15 1702  TSH 3.634   No results for input(s): VITAMINB12, FOLATE, FERRITIN, TIBC, IRON, RETICCTPCT in the last 72 hours. No results for input(s): LIPASE, AMYLASE in the last 72 hours.  Urine Studies No results for input(s): UHGB, CRYS in the last 72 hours.  Invalid input(s): UACOL, UAPR, USPG, UPH, UTP, UGL, UKET, UBIL, UNIT, UROB, ULEU, UEPI, UWBC, URBC, UBAC, CAST, UCOM, BILUA  MICROBIOLOGY: Recent Results (from the past 240 hour(s))  Surgical pcr screen     Status: None   Collection Time: 10/07/15  6:08 PM  Result Value Ref Range Status   MRSA, PCR NEGATIVE NEGATIVE Final   Staphylococcus aureus NEGATIVE NEGATIVE Final    Comment:        The Xpert SA Assay (FDA approved for NASAL specimens in patients over 25 years of age), is one component of a comprehensive surveillance program.  Test performance has been validated by [facility] for patients  greater than or equal to 65 year old. It is not intended to diagnose infection nor to guide or monitor treatment.     RADIOLOGY STUDIES/RESULTS: Ct Head Wo Contrast  10/07/2015  CLINICAL DATA:  Pain following fall EXAM: CT HEAD WITHOUT CONTRAST CT MAXILLOFACIAL WITHOUT CONTRAST CT CERVICAL SPINE WITHOUT CONTRAST TECHNIQUE: Multidetector CT imaging of the head, cervical spine, and maxillofacial structures were performed using the standard protocol without intravenous contrast. Multiplanar CT image reconstructions of the cervical spine and maxillofacial structures were also generated. COMPARISON:  Head CT June 17, 2015 FINDINGS: CT HEAD FINDINGS Mild diffuse atrophy is stable. There is no intracranial mass, hemorrhage, extra-axial fluid collection, or midline shift. There is small vessel disease throughout the centra semiovale bilaterally, stable. No new gray-white compartment lesions are identified. No acute infarct evident. A small calcification in the periphery of the  mid left occipital lobe is stable in may represent a tiny granuloma. The bony calvarium appears intact. The mastoid air cells are clear. CT MAXILLOFACIAL FINDINGS There is no fracture or dislocation. The orbits appear symmetric and normal bilaterally. There are no demonstrable intraorbital lesions. There is opacification of a superior anterior right ethmoid air cell. Other paranasal sinuses are clear. There is no air-fluid level. No bony destruction or expansion. The ostiomeatal unit complexes appear patent bilaterally. There is edema of the right inferior nasal turbinate with narrowing of the right naris. There is no nares obstruction. There is rightward deviation of the nasal septum. Salivary glands appear unremarkable. Atherosclerotic changes noted in both carotid arteries. No adenopathy is appreciable. There is arthropathy in both temporomandibular joints. CT CERVICAL SPINE FINDINGS There is no fracture. Minimal retrolisthesis of C5 on  C6 is felt to be due to underlying spondylosis. There is no other spondylolisthesis. Prevertebral soft tissues and predental space regions are normal. There is facet hypertrophy at multiple levels bilaterally. There is exit foraminal narrowing at multiple levels, most pronounced on the left at C3-4, on the left at C4-5, at C5-6 bilaterally, more severe on the right than on the left, and C6-7 bilaterally, more severe on the right than on the left. There is no frank disc extrusion or high-grade stenosis. There is calcification in each carotid artery. There are multiple nodular lesions in the thyroid consistent with multinodular goiter. IMPRESSION: CT head: Atrophy with supratentorial small vessel disease, stable. No acute infarct evident. No mass, hemorrhage, or extra-axial fluid collection. CT maxillofacial: No fracture or dislocation. No intraorbital lesions. Mild right ethmoid sinus disease. Other paranasal sinuses are clear. There is edema of the inferior right nasal turbinate with nares narrowing on the right. Rightward deviation nasal septum. Temporomandibular joint osteoarthritic change bilaterally. Calcification in each carotid artery. CT cervical spine: Multilevel osteoarthritic change. No fracture. Minimal spondylolisthesis at C5-6 is felt to be due to underlying spondylosis. No other spondylolisthesis. Calcification noted in each carotid artery. Evidence of multinodular goiter. Consider further evaluation with thyroid ultrasound. If patient is clinically hyperthyroid, consider nuclear medicine thyroid uptake and scan. Electronically Signed   By: Bretta Bang III M.D.   On: 10/07/2015 13:37   Ct Cervical Spine Wo Contrast  10/07/2015  CLINICAL DATA:  Pain following fall EXAM: CT HEAD WITHOUT CONTRAST CT MAXILLOFACIAL WITHOUT CONTRAST CT CERVICAL SPINE WITHOUT CONTRAST TECHNIQUE: Multidetector CT imaging of the head, cervical spine, and maxillofacial structures were performed using the standard  protocol without intravenous contrast. Multiplanar CT image reconstructions of the cervical spine and maxillofacial structures were also generated. COMPARISON:  Head CT June 17, 2015 FINDINGS: CT HEAD FINDINGS Mild diffuse atrophy is stable. There is no intracranial mass, hemorrhage, extra-axial fluid collection, or midline shift. There is small vessel disease throughout the centra semiovale bilaterally, stable. No new gray-white compartment lesions are identified. No acute infarct evident. A small calcification in the periphery of the mid left occipital lobe is stable in may represent a tiny granuloma. The bony calvarium appears intact. The mastoid air cells are clear. CT MAXILLOFACIAL FINDINGS There is no fracture or dislocation. The orbits appear symmetric and normal bilaterally. There are no demonstrable intraorbital lesions. There is opacification of a superior anterior right ethmoid air cell. Other paranasal sinuses are clear. There is no air-fluid level. No bony destruction or expansion. The ostiomeatal unit complexes appear patent bilaterally. There is edema of the right inferior nasal turbinate with narrowing of the right naris. There is no  nares obstruction. There is rightward deviation of the nasal septum. Salivary glands appear unremarkable. Atherosclerotic changes noted in both carotid arteries. No adenopathy is appreciable. There is arthropathy in both temporomandibular joints. CT CERVICAL SPINE FINDINGS There is no fracture. Minimal retrolisthesis of C5 on C6 is felt to be due to underlying spondylosis. There is no other spondylolisthesis. Prevertebral soft tissues and predental space regions are normal. There is facet hypertrophy at multiple levels bilaterally. There is exit foraminal narrowing at multiple levels, most pronounced on the left at C3-4, on the left at C4-5, at C5-6 bilaterally, more severe on the right than on the left, and C6-7 bilaterally, more severe on the right than on the left.  There is no frank disc extrusion or high-grade stenosis. There is calcification in each carotid artery. There are multiple nodular lesions in the thyroid consistent with multinodular goiter. IMPRESSION: CT head: Atrophy with supratentorial small vessel disease, stable. No acute infarct evident. No mass, hemorrhage, or extra-axial fluid collection. CT maxillofacial: No fracture or dislocation. No intraorbital lesions. Mild right ethmoid sinus disease. Other paranasal sinuses are clear. There is edema of the inferior right nasal turbinate with nares narrowing on the right. Rightward deviation nasal septum. Temporomandibular joint osteoarthritic change bilaterally. Calcification in each carotid artery. CT cervical spine: Multilevel osteoarthritic change. No fracture. Minimal spondylolisthesis at C5-6 is felt to be due to underlying spondylosis. No other spondylolisthesis. Calcification noted in each carotid artery. Evidence of multinodular goiter. Consider further evaluation with thyroid ultrasound. If patient is clinically hyperthyroid, consider nuclear medicine thyroid uptake and scan. Electronically Signed   By: Bretta Bang III M.D.   On: 10/07/2015 13:37   Pelvis Portable  10/08/2015  CLINICAL DATA:  Postop left hip replacement. EXAM: PORTABLE PELVIS 1-2 VIEWS COMPARISON:  Preoperative hip radiographs 10/07/2015. FINDINGS: A left hip hemiarthroplasty is noted. AP views demonstrate normal positioning. No radiographically evident complication is present. Degenerative changes are noted in the lower lumbar spine. IMPRESSION: 1. Left hip hemiarthroplasty without radiographic evidence for complication based on the AP imaging. Electronically Signed   By: Marin Roberts M.D.   On: 10/08/2015 10:17   Dg Chest Port 1 View  10/07/2015  CLINICAL DATA:  Preop.  Hip fracture. EXAM: PORTABLE CHEST 1 VIEW COMPARISON:  None. FINDINGS: Upper normal heart size. Clear lungs. No pneumothorax. No obvious bony  deformity. Osteopenia. IMPRESSION: No active disease. Electronically Signed   By: Jolaine Click M.D.   On: 10/07/2015 14:36   Dg Knee Left Port  10/07/2015  CLINICAL DATA:  Pain following fall EXAM: PORTABLE LEFT KNEE - 1-2 VIEW COMPARISON:  None. FINDINGS: Frontal and lateral views were obtained. There is no fracture or dislocation. No appreciable joint effusion. There is mid mild generalized joint space narrowing. There is spurring medially and arising from the patella. No erosive change. IMPRESSION: Areas of osteoarthritic change.  No fracture or effusion. Electronically Signed   By: Bretta Bang III M.D.   On: 10/07/2015 19:27   Dg Hip Operative Unilat With Pelvis Left  10/08/2015  CLINICAL DATA:  Left hip arthroplasty. EXAM: OPERATIVE LEFT HIP (WITH PELVIS IF PERFORMED) FRONTAL VIEWS TECHNIQUE: Fluoroscopic spot image(s) were submitted for interpretation post-operatively. COMPARISON:  None. FINDINGS: Three fluoroscopic images of the pelvis/left hip demonstrate left hip replacement. The surgical hardware is in good position with anatomic alignment of the left hip. There is no evidence of immediate complications. Fluoroscopy time is recorded as 9 seconds. IMPRESSION: Intraoperative images from left hip replacement. Electronically Signed  By: Ted Mcalpine M.D.   On: 10/08/2015 09:26   Dg Hips Bilat With Pelvis Min 5 Views  10/07/2015  CLINICAL DATA:  Pain following fall EXAM: DG HIP (WITH OR WITHOUT PELVIS) 5+V BILAT COMPARISON:  None. FINDINGS: Frontal pelvis as well as frontal lateral views of each hip obtained. There is evidence of an impaction type injury at the subcapital femoral neck level on the left. There is no appreciable displacement of fracture fragments. There is no other fracture apparent. No dislocation. There is mild symmetric narrowing of both hip joints. Bones are osteoporotic. There is degenerative change in the visualized lumbar spine. IMPRESSION: Evidence of impaction  type fracture in the subcapital femoral neck region on the left. No other evidence of fracture. No dislocation. Symmetric narrowing both hip joints. Bones osteoporotic. Electronically Signed   By: Bretta Bang III M.D.   On: 10/07/2015 13:51   Ct Maxillofacial Wo Cm  10/07/2015  CLINICAL DATA:  Pain following fall EXAM: CT HEAD WITHOUT CONTRAST CT MAXILLOFACIAL WITHOUT CONTRAST CT CERVICAL SPINE WITHOUT CONTRAST TECHNIQUE: Multidetector CT imaging of the head, cervical spine, and maxillofacial structures were performed using the standard protocol without intravenous contrast. Multiplanar CT image reconstructions of the cervical spine and maxillofacial structures were also generated. COMPARISON:  Head CT June 17, 2015 FINDINGS: CT HEAD FINDINGS Mild diffuse atrophy is stable. There is no intracranial mass, hemorrhage, extra-axial fluid collection, or midline shift. There is small vessel disease throughout the centra semiovale bilaterally, stable. No new gray-white compartment lesions are identified. No acute infarct evident. A small calcification in the periphery of the mid left occipital lobe is stable in may represent a tiny granuloma. The bony calvarium appears intact. The mastoid air cells are clear. CT MAXILLOFACIAL FINDINGS There is no fracture or dislocation. The orbits appear symmetric and normal bilaterally. There are no demonstrable intraorbital lesions. There is opacification of a superior anterior right ethmoid air cell. Other paranasal sinuses are clear. There is no air-fluid level. No bony destruction or expansion. The ostiomeatal unit complexes appear patent bilaterally. There is edema of the right inferior nasal turbinate with narrowing of the right naris. There is no nares obstruction. There is rightward deviation of the nasal septum. Salivary glands appear unremarkable. Atherosclerotic changes noted in both carotid arteries. No adenopathy is appreciable. There is arthropathy in both  temporomandibular joints. CT CERVICAL SPINE FINDINGS There is no fracture. Minimal retrolisthesis of C5 on C6 is felt to be due to underlying spondylosis. There is no other spondylolisthesis. Prevertebral soft tissues and predental space regions are normal. There is facet hypertrophy at multiple levels bilaterally. There is exit foraminal narrowing at multiple levels, most pronounced on the left at C3-4, on the left at C4-5, at C5-6 bilaterally, more severe on the right than on the left, and C6-7 bilaterally, more severe on the right than on the left. There is no frank disc extrusion or high-grade stenosis. There is calcification in each carotid artery. There are multiple nodular lesions in the thyroid consistent with multinodular goiter. IMPRESSION: CT head: Atrophy with supratentorial small vessel disease, stable. No acute infarct evident. No mass, hemorrhage, or extra-axial fluid collection. CT maxillofacial: No fracture or dislocation. No intraorbital lesions. Mild right ethmoid sinus disease. Other paranasal sinuses are clear. There is edema of the inferior right nasal turbinate with nares narrowing on the right. Rightward deviation nasal septum. Temporomandibular joint osteoarthritic change bilaterally. Calcification in each carotid artery. CT cervical spine: Multilevel osteoarthritic change. No fracture. Minimal spondylolisthesis at  C5-6 is felt to be due to underlying spondylosis. No other spondylolisthesis. Calcification noted in each carotid artery. Evidence of multinodular goiter. Consider further evaluation with thyroid ultrasound. If patient is clinically hyperthyroid, consider nuclear medicine thyroid uptake and scan. Electronically Signed   By: Bretta Bang III M.D.   On: 10/07/2015 13:37    Jeoffrey Massed, MD  [facility] Pager:[phone]  If 7PM-7AM, please contact night-coverage [email or web address] Password [email or web address] 10/08/2015, 3:49 PM   LOS: 1 day

## 2015-10-08 NOTE — Anesthesia Postprocedure Evaluation (Signed)
Anesthesia Post Note  Patient: Diane Santana  Procedure(s) Performed: Procedure(s) (LRB): ANTERIOR APPROACH HEMI HIP ARTHROPLASTY (Left)  Anesthesia Post Evaluation   Patient comfortable, hemodynamically stable, maintaining airway without difficulty  Last Vitals:  Filed Vitals:   10/08/15 1028 10/08/15 1117  BP:  118/57  Pulse:  84  Temp: 36.7 C   Resp:  16    Last Pain:  Filed Vitals:   10/08/15 1119  PainSc: Asleep    LLE Motor Response: Responds to commands   RLE Motor Response: Responds to commands        Diane Santana COKER

## 2015-10-08 NOTE — Progress Notes (Signed)
Utilization review completed.  

## 2015-10-08 NOTE — Progress Notes (Signed)
  Echocardiogram 2D Echocardiogram has been performed.  Arvil ChacoFoster, Marisue Canion 10/08/2015, 1:26 PM

## 2015-10-08 NOTE — Progress Notes (Signed)
PT Cancellation Note  Patient Details Name: Diane BoucheMary W Santana MRN: [id number] DOB: 03-04-1926   Cancelled Treatment:    Reason Eval/Treat Not Completed: Medical issues which prohibited therapy (pt currently in surgery and will await post op orders for eval next date)   Delorse Lekabor, Kieth Hartis Beth 10/08/2015, 7:03 AM Delaney MeigsMaija Tabor Herchel Hopkin, PT [phone]

## 2015-10-08 NOTE — Op Note (Signed)
OPERATIVE REPORT  SURGEON: [name], MD   ASSISTANT: Hart Carwin, RNFA.  PREOPERATIVE DIAGNOSIS: Displaced Left femoral neck fracture.   POSTOPERATIVE DIAGNOSIS: Displaced Left femoral neck fracture.   PROCEDURE: Left hip hemiarthroplasty, anterior approach.   IMPLANTS: DePuy Tri Lock stem, size 5, std offset, with a -3 mm spacer and a 45 mm monopolar head ball.  ANESTHESIA:  Spinal  ANTIBIOTICS: 2g ancef.  ESTIMATED BLOOD LOSS: 400 mL.  DRAINS: None.  COMPLICATIONS: None   CONDITION: PACU - hemodynamically stable.   BRIEF CLINICAL NOTE: Diane Santana is a 81 y.o. female with a displaced Left femoral neck fracture. The patient was admitted to the hospitalist service and underwent perioperative risk stratification and medical optimization. The risks, benefits, and alternatives to hemiarthroplasty were explained, and the patient elected to proceed.  PROCEDURE IN DETAIL: The patient was taken to the operating room and general anesthesia was induced on the hospital bed. The patient was then positioned on the Hana table. All bony prominences were well padded. The hip was prepped and draped in the normal sterile surgical fashion. A time-out was called verifying side and site of surgery. Antibiotics were given within 60 minutes of beginning the procedure.  The direct anterior approach to the hip was performed through the Hueter interval. Lateral femoral circumflex vessels were treated with the Auqumantys. The anterior capsule was exposed and an inverted T capsulotomy was made. Fracture hematoma was encountered and evacuated. The patient was found to have a comminuted Left subcapital femoral neck fracture. I freshened the femoral neck cut with a saw. I removed the femoral neck fragment. A corkscrew was placed into the head and the head was removed. This was passed to the back table and was measured.  Acetabular exposure was achieved. I examined the articular  cartilage which was intact. The labrum was intact. A 45 mm trial head was placed and found to have excellent fit.  I then gained femoral exposure taking care to protect the abductors and greater trochanter. This was performed using standard external rotation, extension, and adduction. The capsule was peeled off the inner aspect of the greater trochanter, taking care to preserve the short external rotators. A cookie cutter was used to enter the femoral canal, and then the femoral canal finder was used to confirm location. I then sequentially broached up to a size 5. Calcar planer was used on the femoral neck remnant. I placed a std neck and a 36+ 0 head ball.The hip was reduced. Leg lengths were checked fluoroscopically. The hip was dislocated and trial components were removed. I placed the real stem followed by the real spacer and head ball. A single reduction maneuver was performed and the hip was reduced. Fluoroscopy was used to confirm component position and leg lengths. At 90 degrees of external rotation and extension, the hip was stable to an anterior directed force.  The wound was copiously irrigated with normal saline solution. Marcaine solution was injected into the periarticular soft tissue. The wound was closed in layers using #1 Vicryl and V-Loc for the fascia, 2-0 Vicryl for the subcutaneous fat, 2-0 Monocryl for the deep dermal layer, 3-0 running Monocryl subcuticular stitch and glue for the skin. Once the glue was fully dried, an Aquacell Ag dressing was applied. The patient was then awakened from anesthesia and transported to the recovery room in stable condition. Sponge, needle, and instrument counts were correct at the end of the case x2. The patient tolerated the procedure well and there were no  known complications.  The patient will be readmitted to the hospitalist. She may weight bear as tolerated with a walker. We will give her ASA 325 mg PO twice daily for DVT  prophylaxis. She will work with therapy. I will see her in the office 2 weeks after discharge.

## 2015-10-08 NOTE — Interval H&P Note (Signed)
History and Physical Interval Note:  10/08/2015 7:11 AM  Diane BoucheMary W Santana  has presented today for surgery, with the diagnosis of fx  The various methods of treatment have been discussed with the patient and family. After consideration of risks, benefits and other options for treatment, the patient has consented to  Procedure(s): ANTERIOR APPROACH HEMI HIP ARTHROPLASTY (Left) as a surgical intervention .  The patient's history has been reviewed, patient examined, no change in status, stable for surgery.  I have reviewed the patient's chart and labs.  Questions were answered to the patient's satisfaction.     Diane Santana, Diane ReamsBrian Santana

## 2015-10-08 NOTE — Transfer of Care (Signed)
Immediate Anesthesia Transfer of Care Note  Patient: Diane BoucheMary W Searfoss  Procedure(s) Performed: Procedure(s): ANTERIOR APPROACH HEMI HIP ARTHROPLASTY (Left)  Patient Location: PACU  Anesthesia Type:MAC and Spinal  Level of Consciousness: awake and alert   Airway & Oxygen Therapy: Patient Spontanous Breathing and Patient connected to nasal cannula oxygen  Post-op Assessment: Report given to RN and Post -op Vital signs reviewed and stable  Post vital signs: Reviewed and stable  Last Vitals:  Filed Vitals:   10/08/15 0531 10/08/15 0945  BP: 137/55   Pulse: 96   Temp: 36.9 C 36.8 C  Resp: 17     Complications: No apparent anesthesia complications

## 2015-10-08 NOTE — Progress Notes (Signed)
   Subjective:  I was asked to assume care by Dr. Victorino DikeHewitt. Patient reports pain as severe.  C/o left hip pain. Cardiology evaluation yesterday.  Objective:   VITALS:   Filed Vitals:   10/07/15 1645 10/07/15 1725 10/07/15 2215 10/08/15 0531  BP: 134/72 143/65 137/65 137/55  Pulse: 98 94 96 96  Temp:  98.8 F (37.1 C) 97.6 F (36.4 C) 98.4 F (36.9 C)  TempSrc:  Oral Axillary Axillary  Resp: 15 16 17 17   Weight:      SpO2: 97% 94% 100% 94%    ABD soft LLE: shortened and externally rotated. Pain with logroll. +Ta/GS/EHL. 1+ DP.  Lab Results  Component Value Date   WBC 4.3 10/08/2015   HGB 10.2* 10/08/2015   HCT 30.3* 10/08/2015   MCV 95.0 10/08/2015   PLT 103* 10/08/2015   BMET    Component Value Date/Time   NA 137 10/08/2015 0325   K 4.3 10/08/2015 0325   CL 107 10/08/2015 0325   CO2 22 10/08/2015 0325   GLUCOSE 96 10/08/2015 0325   BUN 23* 10/08/2015 0325   CREATININE 0.82 10/08/2015 0325   CREATININE 0.85 10/14/2013 1003   CALCIUM 8.5* 10/08/2015 0325   GFRNONAA >60 10/08/2015 0325   GFRAA >60 10/08/2015 0325     Assessment/Plan: Day of Surgery   Principal Problem:   Hip fracture (HCC) Active Problems:   Severe dementia   Urinary incontinence   Facial laceration   LBBB (left bundle branch block)   Thrombocytopenia (HCC)   Femoral neck fracture, left, closed, initial encounter   To OR for L hip hemiarthroplasty vs THA Discussed R/B/A with her family. They understand that she has about a 30% risk of 1-year mortality, worse due to immobility if we do not intervene surgically.  The risks, benefits, and alternatives were discussed with the patient/family. There are risks associated with the surgery including, but not limited to, problems with anesthesia (death), infection, instability (giving out of the joint), dislocation, differences in leg length/angulation/rotation, fracture of bones, loosening or failure of implants, hematoma (blood accumulation)  which may require surgical drainage, blood clots, pulmonary embolism, nerve injury (foot drop and lateral thigh numbness), and blood vessel injury. They understand these risks and elect to proceed.    Diane Santana, Diane ReamsBrian James 10/08/2015, 7:12 AM   Diane FredericBrian Maynor Mwangi, MD Cell [phone]

## 2015-10-08 NOTE — Progress Notes (Signed)
Initial Nutrition Assessment  DOCUMENTATION CODES:   Severe malnutrition in context of chronic illness, Underweight  INTERVENTION:   -Once diet is advanced, Boost Breeze po TID, each supplement provides 250 kcal and 9 grams of protein  NUTRITION DIAGNOSIS:   Malnutrition related to chronic illness as evidenced by severe depletion of body fat, severe depletion of muscle mass.  GOAL:   Patient will meet greater than or equal to 90% of their needs  MONITOR:   PO intake, Supplement acceptance, Labs, Weight trends, Skin, I & O's  REASON FOR ASSESSMENT:   Consult Assessment of nutrition requirement/status  ASSESSMENT:   81 y/o female with PMH of dementia fell this morning and was brought to the ER via EMS. She lives with her son. He says that yesterday she was not walking and c/o pain. This morning he heard her fall and says she was unable to get back up. She did not have a fall prior to this morning that he knows of. She c/o pain in the left hip that is worse with motion and better with rest. No h/o previous left hip fracture or surgery. She takes no meds. Son and daughter [place] both [place].  Pt admitted with lt femoral neck fracture.   S/p Procedure(s) on 10/08/15: ANTERIOR APPROACH HEMI HIP ARTHROPLASTY (Left)   Pt just returned from surgery prior to RD visit. Spoke with RN, who reports she suspects diet order should be advanced soon. Unable to obtain hx from pt, due to cognitive deficit.   Per chart review, pt had experienced wt loss and poor appetite PTA. However, wt has been stable over the past year. Noted that pt tried Ensure supplements at home, but does not accept well, as she does not like the taste of them.   Nutrition-Focused physical exam completed. Findings are moderate to severe fat depletion, moderate to severe muscle depletion, and no edema.   Labs reviewed.  Pt meets criteria for severe MALNUTRITION in the context of chronic illness as evidenced by severe  fat and muscle depletion.  Diet Order:  Diet clear liquid Room service appropriate?: Yes; Fluid consistency:: Thin  Skin:  Reviewed, no issues  Last BM:  PTA  Height:   Ht Readings from Last 1 Encounters:  06/21/15 5' (1.524 m)    Weight:   Wt Readings from Last 1 Encounters:  10/07/15 89 lb (40.37 kg)    Ideal Body Weight:  45.5 kg  BMI:  Body mass index is 17.38 kg/(m^2).  Estimated Nutritional Needs:   Kcal:  1200-1400  Protein:  50-65 grams  Fluid:  1.2-1.4 L  EDUCATION NEEDS:   No education needs identified at this time  Rivaan Kendall A. Mayford KnifeWilliams, RD, LDN, CDE Pager: [phone] After hours Pager: [phone]

## 2015-10-09 DIAGNOSIS — S72012A Unspecified intracapsular fracture of left femur, initial encounter for closed fracture: Secondary | ICD-10-CM | POA: Diagnosis not present

## 2015-10-09 LAB — BASIC METABOLIC PANEL
Anion gap: 5 (ref 5–15)
BUN: 26 mg/dL — AB (ref 6–20)
CHLORIDE: 105 mmol/L (ref 101–111)
CO2: 25 mmol/L (ref 22–32)
Calcium: 8.1 mg/dL — ABNORMAL LOW (ref 8.9–10.3)
Creatinine, Ser: 0.95 mg/dL (ref 0.44–1.00)
GFR calc Af Amer: >60 mL/min (ref >60)
GFR calc non Af Amer: 52 mL/min — ABNORMAL LOW (ref >60)
GLUCOSE: 133 mg/dL — AB (ref 65–99)
POTASSIUM: 3.6 mmol/L (ref 3.5–5.1)
SODIUM: 135 mmol/L (ref 135–145)

## 2015-10-09 LAB — CBC
HEMATOCRIT: 22.3 % — AB (ref 36.0–46.0)
HEMATOCRIT: 28.7 % — AB (ref 36.0–46.0)
HEMOGLOBIN: 7.9 g/dL — AB (ref 12.0–15.0)
HEMOGLOBIN: 10.1 g/dL — AB (ref 12.0–15.0)
MCH: 32.2 pg (ref 26.0–34.0)
MCH: 32.3 pg (ref 26.0–34.0)
MCHC: 34.5 g/dL (ref 30.0–36.0)
MCHC: 35.2 g/dL (ref 30.0–36.0)
MCV: 93.3 fL (ref 78.0–100.0)
MCV: 91.7 fL (ref 78.0–100.0)
Platelets: 86 10*3/uL — ABNORMAL LOW (ref 150–400)
Platelets: 91 10*3/uL — ABNORMAL LOW (ref 150–400)
RBC: 2.39 MIL/uL — AB (ref 3.87–5.11)
RBC: 3.13 MIL/uL — ABNORMAL LOW (ref 3.87–5.11)
RDW: 13.4 % (ref 11.5–15.5)
RDW: 14.5 % (ref 11.5–15.5)
WBC: 7.5 10*3/uL (ref 4.0–10.5)
WBC: 8.7 10*3/uL (ref 4.0–10.5)

## 2015-10-09 LAB — PREPARE RBC (CROSSMATCH)

## 2015-10-09 LAB — VITAMIN D 25 HYDROXY (VIT D DEFICIENCY, FRACTURES): Vit D, 25-Hydroxy: 14.4 ng/mL — ABNORMAL LOW (ref 30.0–100.0)

## 2015-10-09 MED ORDER — SODIUM CHLORIDE 0.9 % IV SOLN
Freq: Once | INTRAVENOUS | Status: AC
  Administered 2015-10-09: 09:00:00 via INTRAVENOUS

## 2015-10-09 MED ORDER — PNEUMOCOCCAL VAC POLYVALENT 25 MCG/0.5ML IJ INJ: 0.5000 mL | INJECTION | INTRAMUSCULAR | Status: DC | PRN

## 2015-10-09 MED ORDER — INFLUENZA VAC SPLIT QUAD 0.5 ML IM SUSY: 0.5000 mL | PREFILLED_SYRINGE | INTRAMUSCULAR | Status: DC | PRN

## 2015-10-09 MED ORDER — FUROSEMIDE 10 MG/ML IJ SOLN
20.0000 mg | Freq: Once | INTRAMUSCULAR | Status: AC
  Administered 2015-10-09: 20 mg via INTRAVENOUS
  Filled 2015-10-09: qty 2

## 2015-10-09 MED ORDER — DIPHENHYDRAMINE HCL 50 MG/ML IJ SOLN
25.0000 mg | Freq: Once | INTRAMUSCULAR | Status: AC
  Administered 2015-10-09: 25 mg via INTRAVENOUS
  Filled 2015-10-09: qty 1

## 2015-10-09 NOTE — Progress Notes (Addendum)
PATIENT DETAILS Name: Diane Santana Age: 81 y.o. Sex: female Date of Birth: 1926-04-11 Admit Date: 10/07/2015 Admitting Physician Alba Cory, MD ZOX:[id number] Claiborne Billings, DO   Brief narrative: 81 year old patient with advanced dementia-living at home with 24/7 care-presented to the hospital following a mechanical fall and was found to have left hip fracture. Underwent left hip repair on 11/25, postoperative course complicated by development of acute postoperative blood loss anemia and mild delirium. Plans are for SNF over the next few days  Subjective: Pleasantly confused  Assessment/Plan: Principal Problem: Left  Hip fracture: Suspect from a mechanical fall. Seen by orthopedics, underwent left hip hemiarthroplasty on 11/25. Recommendations from orthopedics weightbearing as tolerated with a walker, aspirin 325 mg twice daily for DVT prophylaxis. PT evaluation completed, SNF recommended-social worker consulted   Active Problems: Anemia: Likely acute postoperative blood loss anemia-transfuse 1 unit-follow CBC.  Dementia with delirium: Has developed a mild delirium-secondary to pain/narcotics-supportive care. Continue to minimize narcotics/sedatives as much as possible.   Prolonged QTC: Likely not reliable-in a setting of left bundle branch block. Monitor  LBBB: Chronic issue-both cardiology note-this was seen on a prior EKG on 06/17/15  Chronic systolic heart failure (EF by echo 40-50% on 11/25): No regional wall motion abnormalities, clinically compensated-suspect given advanced age/dementia no further workup required.   Small left facial skin tear: Appreciate wound care consultation-follow-have instructed RN to dose TdaP  Protein-calorie malnutrition, severe: Continue supplements, appreciate nutrition evaluation  Disposition: Remain inpatient-may require SNF on discharge  Antimicrobial agents  See below  Anti-infectives    Start     Dose/Rate Route Frequency  Ordered Stop   10/08/15 1300  ceFAZolin (ANCEF) IVPB 2 g/50 mL premix     2 g 100 mL/hr over 30 Minutes Intravenous Every 6 hours 10/08/15 1114 10/08/15 2358   10/08/15 0630  ceFAZolin (ANCEF) IVPB 1 g/50 mL premix  Status:  Discontinued     1 g 100 mL/hr over 30 Minutes Intravenous To Surgery 10/07/15 1647 10/07/15 1722   10/08/15 0600  ceFAZolin (ANCEF) IVPB 2 g/50 mL premix     2 g 100 mL/hr over 30 Minutes Intravenous On call to O.R. 10/07/15 1716 10/08/15 0750      DVT Prophylaxis: ASA/SCD's  Code Status: DNR  Family Communication Son/Daughter at bedside  Procedures: 11/25>> left hip hemiarthroplasty  CONSULTS:  cardiology and orthopedic surgery  Time spent 30 minutes-Greater than 50% of this time was spent in counseling, explanation of diagnosis, planning of further management, and coordination of care.  MEDICATIONS: Scheduled Meds: . acetaminophen  1,000 mg Oral 3 times per day  . aspirin EC  325 mg Oral BID PC  . famotidine  20 mg Oral BID  . feeding supplement  1 Container Oral TID BM  . neomycin-bacitracin-polymyxin   Topical BID  . senna  1 tablet Oral BID  . Tdap  0.5 mL Intramuscular Once   Continuous Infusions:   PRN Meds:.Influenza vac split quadrivalent PF, menthol-cetylpyridinium **OR** phenol, metoCLOPramide **OR** metoCLOPramide (REGLAN) injection, morphine injection, ondansetron **OR** ondansetron (ZOFRAN) IV, oxyCODONE, pneumococcal 23 valent vaccine, sodium phosphate, sorbitol    PHYSICAL EXAM: Vital signs in last 24 hours: Filed Vitals:   10/09/15 0458 10/09/15 0922 10/09/15 0937 10/09/15 1300  BP: 100/60 115/50 113/48 129/70  Pulse: 101 90 80 91  Temp: 98.8 F (37.1 C) 98 F (36.7 C) 98 F (36.7 C) 98 F (36.7 C)  TempSrc:  Oral Axillary Axillary Oral  Resp: 16 16 16 16   Weight:      SpO2: 94% 100% 100% 98%    Weight change:  Filed Weights   10/07/15 1115  Weight: 40.37 kg (89 lb)   Body mass index is 17.38 kg/(m^2).   Gen  Exam: Pleasantly confused.  Neck: Supple, No JVD.   Chest: B/L Clear.   CVS: S1 S2 Regular, no murmurs.  Abdomen: soft, BS +, non tender, non distended.  Extremities: no edema, lower extremities warm to touch. Neurologic: Non Focal.   Skin: No Rash.   Wounds: N/A.    Intake/Output from previous day:  Intake/Output Summary (Last 24 hours) at 10/09/15 1435 Last data filed at 10/09/15 1610  Gross per 24 hour  Intake    805 ml  Output    500 ml  Net    305 ml     LAB RESULTS: CBC  Recent Labs Lab 10/07/15 1205 10/08/15 0325 10/09/15 0354  WBC 10.2 4.3 7.5  HGB 11.5* 10.2* 7.9*  HCT 33.5* 30.3* 22.3*  PLT 124* 103* 86*  MCV 96.3 95.0 93.3  MCH 33.0 32.0 32.2  MCHC 34.3 33.7 34.5  RDW 13.8 13.6 13.4    Chemistries   Recent Labs Lab 10/07/15 1227 10/08/15 0325 10/09/15 0354  NA 140 137 135  K 4.8 4.3 3.6  CL 106 107 105  CO2 28 22 25   GLUCOSE 99 96 133*  BUN 21* 23* 26*  CREATININE 0.85 0.82 0.95  CALCIUM 9.3 8.5* 8.1*    CBG: No results for input(s): GLUCAP in the last 168 hours.  GFR Estimated Creatinine Clearance: 26.6 mL/min (by C-G formula based on Cr of 0.95).  Coagulation profile  Recent Labs Lab 10/07/15 1639  INR 1.09    Cardiac Enzymes  Recent Labs Lab 10/07/15 2115 10/08/15 0325 10/08/15 1220  TROPONINI <0.03 <0.03 <0.03    Invalid input(s): POCBNP No results for input(s): DDIMER in the last 72 hours. No results for input(s): HGBA1C in the last 72 hours. No results for input(s): CHOL, HDL, LDLCALC, TRIG, CHOLHDL, LDLDIRECT in the last 72 hours.  Recent Labs  10/07/15 1702  TSH 3.634   No results for input(s): VITAMINB12, FOLATE, FERRITIN, TIBC, IRON, RETICCTPCT in the last 72 hours. No results for input(s): LIPASE, AMYLASE in the last 72 hours.  Urine Studies No results for input(s): UHGB, CRYS in the last 72 hours.  Invalid input(s): UACOL, UAPR, USPG, UPH, UTP, UGL, UKET, UBIL, UNIT, UROB, ULEU, UEPI, UWBC, URBC,  UBAC, CAST, UCOM, BILUA  MICROBIOLOGY: Recent Results (from the past 240 hour(s))  Surgical pcr screen     Status: None   Collection Time: 10/07/15  6:08 PM  Result Value Ref Range Status   MRSA, PCR NEGATIVE NEGATIVE Final   Staphylococcus aureus NEGATIVE NEGATIVE Final    Comment:        The Xpert SA Assay (FDA approved for NASAL specimens in patients over 76 years of age), is one component of a comprehensive surveillance program.  Test performance has been validated by [employer] for patients greater than or equal to 81 year old. It is not intended to diagnose infection nor to guide or monitor treatment.     RADIOLOGY STUDIES/RESULTS: Ct Head Wo Contrast  10/07/2015  CLINICAL DATA:  Pain following fall EXAM: CT HEAD WITHOUT CONTRAST CT MAXILLOFACIAL WITHOUT CONTRAST CT CERVICAL SPINE WITHOUT CONTRAST TECHNIQUE: Multidetector CT imaging of the head, cervical spine, and maxillofacial structures were performed using  the standard protocol without intravenous contrast. Multiplanar CT image reconstructions of the cervical spine and maxillofacial structures were also generated. COMPARISON:  Head CT June 17, 2015 FINDINGS: CT HEAD FINDINGS Mild diffuse atrophy is stable. There is no intracranial mass, hemorrhage, extra-axial fluid collection, or midline shift. There is small vessel disease throughout the centra semiovale bilaterally, stable. No new gray-white compartment lesions are identified. No acute infarct evident. A small calcification in the periphery of the mid left occipital lobe is stable in may represent a tiny granuloma. The bony calvarium appears intact. The mastoid air cells are clear. CT MAXILLOFACIAL FINDINGS There is no fracture or dislocation. The orbits appear symmetric and normal bilaterally. There are no demonstrable intraorbital lesions. There is opacification of a superior anterior right ethmoid air cell. Other paranasal sinuses are clear. There is no air-fluid level.  No bony destruction or expansion. The ostiomeatal unit complexes appear patent bilaterally. There is edema of the right inferior nasal turbinate with narrowing of the right naris. There is no nares obstruction. There is rightward deviation of the nasal septum. Salivary glands appear unremarkable. Atherosclerotic changes noted in both carotid arteries. No adenopathy is appreciable. There is arthropathy in both temporomandibular joints. CT CERVICAL SPINE FINDINGS There is no fracture. Minimal retrolisthesis of C5 on C6 is felt to be due to underlying spondylosis. There is no other spondylolisthesis. Prevertebral soft tissues and predental space regions are normal. There is facet hypertrophy at multiple levels bilaterally. There is exit foraminal narrowing at multiple levels, most pronounced on the left at C3-4, on the left at C4-5, at C5-6 bilaterally, more severe on the right than on the left, and C6-7 bilaterally, more severe on the right than on the left. There is no frank disc extrusion or high-grade stenosis. There is calcification in each carotid artery. There are multiple nodular lesions in the thyroid consistent with multinodular goiter. IMPRESSION: CT head: Atrophy with supratentorial small vessel disease, stable. No acute infarct evident. No mass, hemorrhage, or extra-axial fluid collection. CT maxillofacial: No fracture or dislocation. No intraorbital lesions. Mild right ethmoid sinus disease. Other paranasal sinuses are clear. There is edema of the inferior right nasal turbinate with nares narrowing on the right. Rightward deviation nasal septum. Temporomandibular joint osteoarthritic change bilaterally. Calcification in each carotid artery. CT cervical spine: Multilevel osteoarthritic change. No fracture. Minimal spondylolisthesis at C5-6 is felt to be due to underlying spondylosis. No other spondylolisthesis. Calcification noted in each carotid artery. Evidence of multinodular goiter. Consider further  evaluation with thyroid ultrasound. If patient is clinically hyperthyroid, consider nuclear medicine thyroid uptake and scan. Electronically Signed   By: Bretta Bang III M.D.   On: 10/07/2015 13:37   Ct Cervical Spine Wo Contrast  10/07/2015  CLINICAL DATA:  Pain following fall EXAM: CT HEAD WITHOUT CONTRAST CT MAXILLOFACIAL WITHOUT CONTRAST CT CERVICAL SPINE WITHOUT CONTRAST TECHNIQUE: Multidetector CT imaging of the head, cervical spine, and maxillofacial structures were performed using the standard protocol without intravenous contrast. Multiplanar CT image reconstructions of the cervical spine and maxillofacial structures were also generated. COMPARISON:  Head CT June 17, 2015 FINDINGS: CT HEAD FINDINGS Mild diffuse atrophy is stable. There is no intracranial mass, hemorrhage, extra-axial fluid collection, or midline shift. There is small vessel disease throughout the centra semiovale bilaterally, stable. No new gray-white compartment lesions are identified. No acute infarct evident. A small calcification in the periphery of the mid left occipital lobe is stable in may represent a tiny granuloma. The bony calvarium appears intact. The  mastoid air cells are clear. CT MAXILLOFACIAL FINDINGS There is no fracture or dislocation. The orbits appear symmetric and normal bilaterally. There are no demonstrable intraorbital lesions. There is opacification of a superior anterior right ethmoid air cell. Other paranasal sinuses are clear. There is no air-fluid level. No bony destruction or expansion. The ostiomeatal unit complexes appear patent bilaterally. There is edema of the right inferior nasal turbinate with narrowing of the right naris. There is no nares obstruction. There is rightward deviation of the nasal septum. Salivary glands appear unremarkable. Atherosclerotic changes noted in both carotid arteries. No adenopathy is appreciable. There is arthropathy in both temporomandibular joints. CT CERVICAL  SPINE FINDINGS There is no fracture. Minimal retrolisthesis of C5 on C6 is felt to be due to underlying spondylosis. There is no other spondylolisthesis. Prevertebral soft tissues and predental space regions are normal. There is facet hypertrophy at multiple levels bilaterally. There is exit foraminal narrowing at multiple levels, most pronounced on the left at C3-4, on the left at C4-5, at C5-6 bilaterally, more severe on the right than on the left, and C6-7 bilaterally, more severe on the right than on the left. There is no frank disc extrusion or high-grade stenosis. There is calcification in each carotid artery. There are multiple nodular lesions in the thyroid consistent with multinodular goiter. IMPRESSION: CT head: Atrophy with supratentorial small vessel disease, stable. No acute infarct evident. No mass, hemorrhage, or extra-axial fluid collection. CT maxillofacial: No fracture or dislocation. No intraorbital lesions. Mild right ethmoid sinus disease. Other paranasal sinuses are clear. There is edema of the inferior right nasal turbinate with nares narrowing on the right. Rightward deviation nasal septum. Temporomandibular joint osteoarthritic change bilaterally. Calcification in each carotid artery. CT cervical spine: Multilevel osteoarthritic change. No fracture. Minimal spondylolisthesis at C5-6 is felt to be due to underlying spondylosis. No other spondylolisthesis. Calcification noted in each carotid artery. Evidence of multinodular goiter. Consider further evaluation with thyroid ultrasound. If patient is clinically hyperthyroid, consider nuclear medicine thyroid uptake and scan. Electronically Signed   By: Bretta Bang III M.D.   On: 10/07/2015 13:37   Pelvis Portable  10/08/2015  CLINICAL DATA:  Postop left hip replacement. EXAM: PORTABLE PELVIS 1-2 VIEWS COMPARISON:  Preoperative hip radiographs 10/07/2015. FINDINGS: A left hip hemiarthroplasty is noted. AP views demonstrate normal  positioning. No radiographically evident complication is present. Degenerative changes are noted in the lower lumbar spine. IMPRESSION: 1. Left hip hemiarthroplasty without radiographic evidence for complication based on the AP imaging. Electronically Signed   By: Marin Roberts M.D.   On: 10/08/2015 10:17   Dg Chest Port 1 View  10/07/2015  CLINICAL DATA:  Preop.  Hip fracture. EXAM: PORTABLE CHEST 1 VIEW COMPARISON:  None. FINDINGS: Upper normal heart size. Clear lungs. No pneumothorax. No obvious bony deformity. Osteopenia. IMPRESSION: No active disease. Electronically Signed   By: Jolaine Click M.D.   On: 10/07/2015 14:36   Dg Knee Left Port  10/07/2015  CLINICAL DATA:  Pain following fall EXAM: PORTABLE LEFT KNEE - 1-2 VIEW COMPARISON:  None. FINDINGS: Frontal and lateral views were obtained. There is no fracture or dislocation. No appreciable joint effusion. There is mid mild generalized joint space narrowing. There is spurring medially and arising from the patella. No erosive change. IMPRESSION: Areas of osteoarthritic change.  No fracture or effusion. Electronically Signed   By: Bretta Bang III M.D.   On: 10/07/2015 19:27   Dg Hip Operative Unilat With Pelvis Left  10/08/2015  CLINICAL DATA:  Left hip arthroplasty. EXAM: OPERATIVE LEFT HIP (WITH PELVIS IF PERFORMED) FRONTAL VIEWS TECHNIQUE: Fluoroscopic spot image(s) were submitted for interpretation post-operatively. COMPARISON:  None. FINDINGS: Three fluoroscopic images of the pelvis/left hip demonstrate left hip replacement. The surgical hardware is in good position with anatomic alignment of the left hip. There is no evidence of immediate complications. Fluoroscopy time is recorded as 9 seconds. IMPRESSION: Intraoperative images from left hip replacement. Electronically Signed   By: Ted Mcalpine M.D.   On: 10/08/2015 09:26   Dg Hips Bilat With Pelvis Min 5 Views  10/07/2015  CLINICAL DATA:  Pain following fall EXAM: DG  HIP (WITH OR WITHOUT PELVIS) 5+V BILAT COMPARISON:  None. FINDINGS: Frontal pelvis as well as frontal lateral views of each hip obtained. There is evidence of an impaction type injury at the subcapital femoral neck level on the left. There is no appreciable displacement of fracture fragments. There is no other fracture apparent. No dislocation. There is mild symmetric narrowing of both hip joints. Bones are osteoporotic. There is degenerative change in the visualized lumbar spine. IMPRESSION: Evidence of impaction type fracture in the subcapital femoral neck region on the left. No other evidence of fracture. No dislocation. Symmetric narrowing both hip joints. Bones osteoporotic. Electronically Signed   By: Bretta Bang III M.D.   On: 10/07/2015 13:51   Ct Maxillofacial Wo Cm  10/07/2015  CLINICAL DATA:  Pain following fall EXAM: CT HEAD WITHOUT CONTRAST CT MAXILLOFACIAL WITHOUT CONTRAST CT CERVICAL SPINE WITHOUT CONTRAST TECHNIQUE: Multidetector CT imaging of the head, cervical spine, and maxillofacial structures were performed using the standard protocol without intravenous contrast. Multiplanar CT image reconstructions of the cervical spine and maxillofacial structures were also generated. COMPARISON:  Head CT June 17, 2015 FINDINGS: CT HEAD FINDINGS Mild diffuse atrophy is stable. There is no intracranial mass, hemorrhage, extra-axial fluid collection, or midline shift. There is small vessel disease throughout the centra semiovale bilaterally, stable. No new gray-white compartment lesions are identified. No acute infarct evident. A small calcification in the periphery of the mid left occipital lobe is stable in may represent a tiny granuloma. The bony calvarium appears intact. The mastoid air cells are clear. CT MAXILLOFACIAL FINDINGS There is no fracture or dislocation. The orbits appear symmetric and normal bilaterally. There are no demonstrable intraorbital lesions. There is opacification of a  superior anterior right ethmoid air cell. Other paranasal sinuses are clear. There is no air-fluid level. No bony destruction or expansion. The ostiomeatal unit complexes appear patent bilaterally. There is edema of the right inferior nasal turbinate with narrowing of the right naris. There is no nares obstruction. There is rightward deviation of the nasal septum. Salivary glands appear unremarkable. Atherosclerotic changes noted in both carotid arteries. No adenopathy is appreciable. There is arthropathy in both temporomandibular joints. CT CERVICAL SPINE FINDINGS There is no fracture. Minimal retrolisthesis of C5 on C6 is felt to be due to underlying spondylosis. There is no other spondylolisthesis. Prevertebral soft tissues and predental space regions are normal. There is facet hypertrophy at multiple levels bilaterally. There is exit foraminal narrowing at multiple levels, most pronounced on the left at C3-4, on the left at C4-5, at C5-6 bilaterally, more severe on the right than on the left, and C6-7 bilaterally, more severe on the right than on the left. There is no frank disc extrusion or high-grade stenosis. There is calcification in each carotid artery. There are multiple nodular lesions in the thyroid consistent with multinodular goiter. IMPRESSION:  CT head: Atrophy with supratentorial small vessel disease, stable. No acute infarct evident. No mass, hemorrhage, or extra-axial fluid collection. CT maxillofacial: No fracture or dislocation. No intraorbital lesions. Mild right ethmoid sinus disease. Other paranasal sinuses are clear. There is edema of the inferior right nasal turbinate with nares narrowing on the right. Rightward deviation nasal septum. Temporomandibular joint osteoarthritic change bilaterally. Calcification in each carotid artery. CT cervical spine: Multilevel osteoarthritic change. No fracture. Minimal spondylolisthesis at C5-6 is felt to be due to underlying spondylosis. No other  spondylolisthesis. Calcification noted in each carotid artery. Evidence of multinodular goiter. Consider further evaluation with thyroid ultrasound. If patient is clinically hyperthyroid, consider nuclear medicine thyroid uptake and scan. Electronically Signed   By: Bretta BangWilliam  Woodruff III M.D.   On: 10/07/2015 13:37    Jeoffrey MassedGHIMIRE,SHANKER, MD  [facility] Pager:[phone]  If 7PM-7AM, please contact night-coverage [email or web address] Password [email or web address] 10/09/2015, 2:35 PM   LOS: 2 days

## 2015-10-09 NOTE — Evaluation (Signed)
Clinical/Bedside Swallow Evaluation Patient Details  Name: Diane Santana: [id number] Date of Birth: [birth year]  Today's Date: 10/09/2015 Time: SLP Start Time (ACUTE ONLY): 1020 SLP Stop Time (ACUTE ONLY): 1040 SLP Time Calculation (min) (ACUTE ONLY): 20 min  Past Medical History:  Past Medical History  Diagnosis Date  . Acute meniscal tear of knee     left knee  . Urgency of urination   . Nocturia   . Dementia    Past Surgical History:  Past Surgical History  Procedure Laterality Date  . Cataract extraction w/ intraocular lens  implant, bilateral    . Dilation and curettage of uterus  2004 (APPROX)  . Knee arthroscopy with lateral menisectomy Left 03/04/2013    Procedure: LEFT KNEE ARTHROSCOPY PARTIAL MEDIAL AND LATERAL   MENISCECTOMY;  Surgeon: Drucilla SchmidtJames P Aplington, MD;  Location: [facility];  Service: Orthopedics;  Laterality: Left;   HPI:  81 year old patient with advanced dementia-living at home with 24/7 care-presented to the hospital following a mechanical fall and was found to have left hip fracture. Underwent left hip repair on 11/25, postoperative course complicated by development of acute postoperative blood loss anemia and mild delirium. Plans are for SNF over the next few days   Assessment / Plan / Recommendation Clinical Impression  Patient presents with a moderate oropharyngeal dysphagia characterized by immediate and delayed coughing with thin liquids, delayed swallow initiation. Patient also with cognitive impact to dysphagia, resulting in/contributing to poor awareness of bolus prior to and during swallow, poor oral manipulation and transit. Patient demonstrated improved awareness to bolus when SLP gently placed her gloved hand (restraint glove/mitt) on cup and brought it slowly to her lips. Patient did not exhibit any overt s/s of aspiraiton with nectar thick liquids via cup sips.    Aspiration Risk  Moderate aspiration risk    Diet Recommendation  Dysphagia 1 (Puree);Nectar-thick liquid   Liquid Administration via: Cup;No straw Medication Administration: Crushed with puree Supervision: Full supervision/cueing for compensatory strategies;Staff to assist with self feeding Compensations: Minimize environmental distractions;Slow rate;Small sips/bites;Other (Comment) (check for pocketing/holding of food) Postural Changes: Seated upright at 90 degrees    Other  Recommendations Oral Care Recommendations: Oral care BID Other Recommendations: Order thickener from pharmacy   Follow up Recommendations  Skilled Nursing facility;24 hour supervision/assistance    Frequency and Duration min 3x week  2 weeks       Prognosis Prognosis for Safe Diet Advancement: Good Barriers to Reach Goals: Cognitive deficits      Swallow Study   General HPI: [age] year old patient with advanced dementia-living at home with 24/7 care-presented to the hospital following a mechanical fall and was found to have left hip fracture. Underwent left hip repair on 11/25, postoperative course complicated by development of acute postoperative blood loss anemia and mild delirium. Plans are for SNF over the next few days    Oral/Motor/Sensory Function Overall Oral Motor/Sensory Function: Generalized oral weakness Facial ROM: Within Functional Limits Facial Symmetry: Within Functional Limits Facial Strength: Within Functional Limits Facial Sensation: Within Functional Limits Lingual ROM: Reduced right;Reduced left Lingual Symmetry: Within Functional Limits Lingual Strength: Reduced Velum: Within Functional Limits Mandible: Within Functional Limits   Ice Chips Ice chips: Not tested   Thin Liquid Thin Liquid: Impaired Presentation: Cup;Straw Oral Phase Impairments: Poor awareness of bolus Pharyngeal  Phase Impairments: Suspected delayed Swallow;Cough - Delayed;Cough - Immediate    Nectar Thick Nectar Thick Liquid: Impaired Presentation: Cup Oral Phase Impairments:  Poor awareness of bolus  Honey Thick Honey Thick Liquid: Not tested   Puree Puree: Impaired Oral Phase Impairments: Poor awareness of bolus;Reduced lingual movement/coordination Pharyngeal Phase Impairments: Suspected delayed Swallow   Solid Solid: Not tested       Angela Nevin, MA, CCC-SLP 10/09/2015 4:43 PM

## 2015-10-09 NOTE — Evaluation (Signed)
Physical Therapy Evaluation Patient Details Name: Diane Santana MRN: [id number] DOB: [birth year] Today's Date: 10/09/2015   History of Present Illness  81 y/o female s/p L hip hemiarthroplasty anterior approach to repair L femoral neck fx following a fall at home. PMHx of dementia and CHF. ECG indicated L bundle branch block.   Clinical Impression  Pt was severely disoriented upon arrival. Pt presents with global weakness in UEs and LEs, and decreased ROM, balance, and coordination. Pt would benefit from skilled PT to improve strength, ROM, ambulation, and safety with transfers in order to decrease burden of care. D/C to SNF is most appropriate at this time. Discussed POC with daughter and she verbally agreed.      Follow Up Recommendations SNF;Supervision/Assistance - 24 hour    Equipment Recommendations       Recommendations for Other Services OT consult     Precautions / Restrictions Restrictions Weight Bearing Restrictions: Yes LLE Weight Bearing: Weight bearing as tolerated      Mobility  Bed Mobility Overal bed mobility: Needs Assistance;+2 for physical assistance Bed Mobility: Supine to Sit     Supine to sit: Total assist;+2 for physical assistance;HOB elevated     General bed mobility comments: Required total assist for trunk elevation, helicopter pivoting, and scooting to EOB  Transfers Overall transfer level: Needs assistance   Transfers: Sit to/from Stand;Squat Pivot Transfers Sit to Stand: Max assist   Squat pivot transfers: Total assist     General transfer comment: Pt actively stood up with max assist 3 trials, max assist to remain standing due to posterior lean. Squat pivot transfer total assist with bed pad, blocked knees.   Ambulation/Gait                Stairs            Wheelchair Mobility    Modified Rankin (Stroke Patients Only)       Balance Overall balance assessment: Needs assistance Sitting-balance support: Feet  supported Sitting balance-Leahy Scale: Fair   Postural control: Posterior lean (during standing)   Standing balance-Leahy Scale: Zero                               Pertinent Vitals/Pain Pain Assessment: No/denies pain    Home Living Family/patient expects to be discharged to:: Private residence Living Arrangements: Children Available Help at Discharge: Family;Personal care attendant;Available 24 hours/day Type of Home: House                Prior Function Level of Independence: Needs assistance   Gait / Transfers Assistance Needed: Independent, walked up to 1/2 mile with son PTA  ADL's / Homemaking Assistance Needed: Total assist for homemaking, max assist for ADLs pt was able to perform dishwashing, has 24hr assistance at home between sitter and children. She needed assistance with showering or bathing        Hand Dominance        Extremity/Trunk Assessment   Upper Extremity Assessment: Generalized weakness           Lower Extremity Assessment: Generalized weakness;LLE deficits/detail;RLE deficits/detail RLE Deficits / Details: Significant tightness preventing full knee ext. LLE Deficits / Details: Significant tightness preventing full knee ext.  Cervical / Trunk Assessment: Kyphotic  Communication   Communication: Other (comment) (disoriented to time, place, situation. Pt was difficult to understand on occasion)  Cognition Arousal/Alertness: Lethargic   Overall Cognitive Status: History of cognitive impairments - at  baseline (PMHx of dementia)       Memory: Decreased short-term memory              General Comments      Exercises General Exercises - Lower Extremity Ankle Circles/Pumps: AAROM;Both;10 reps;Seated Long Arc Quad: AAROM;Both;10 reps;Seated Hip Flexion/Marching: AAROM;Both;10 reps;Seated      Assessment/Plan    PT Assessment Patient needs continued PT services  PT Diagnosis Difficulty walking;Altered mental  status;Generalized weakness   PT Problem List Decreased strength;Decreased range of motion;Decreased activity tolerance;Decreased balance;Decreased mobility;Decreased cognition;Decreased safety awareness  PT Treatment Interventions Gait training;Functional mobility training;DME instruction;Balance training;Patient/family education;Therapeutic exercise   PT Goals (Current goals can be found in the Care Plan section)      Frequency Min 3X/week   Barriers to discharge        Co-evaluation               End of Session   Activity Tolerance: Patient tolerated treatment well Patient left: in chair;with call bell/phone within reach;with chair alarm set Nurse Communication: Mobility status         Time: [id number] PT Time Calculation (min) (ACUTE ONLY): 21 min   Charges:   PT Evaluation $Initial PT Evaluation Tier I: 1 Procedure     PT G CodesJimmy Picket:        Amalio Loe 10/09/2015, 12:05 PM Jimmy PicketJustin Zamyah Wiesman, SPT 10/09/2015 12:05 PM

## 2015-10-09 NOTE — Evaluation (Signed)
Occupational Therapy Evaluation Patient Details Name: Diane Santana MRN: [id number] DOB: 07/04/1926 Today's Date: 10/09/2015    History of Present Illness 81 y/o female s/p L hip hemiarthroplasty anterior approach to repair L femoral neck fx following a fall at home. PMHx of dementia and CHF. ECG indicated L bundle branch block.    Clinical Impression   Per family report, pt required assist with ADLs PTA. Currently pt is total assist for ADLs and total assist +2 for functional mobility. Family and close friend report significant decline in pts cognitive status since hospital admission. Pt disoriented x 4 and unable to follow simple one step commands. Discussed SNF placement for further rehab with family; family is agreeable to SNF at this time. Recommending SNF for further rehab prior to returning home. No acute OT needs identified; all further OT needs can be met at next venue of care. Please re-consult if change in medical status occurs. Thank you for this referral.     Follow Up Recommendations  SNF;Supervision/Assistance - 24 hour    Equipment Recommendations  Other (comment) (TBD at next venue)    Recommendations for Other Services       Precautions / Restrictions Precautions Precautions: Fall Restrictions Weight Bearing Restrictions: Yes LLE Weight Bearing: Weight bearing as tolerated      Mobility Bed Mobility Overal bed mobility: Needs Assistance;+2 for physical assistance Bed Mobility: Supine to Sit     Supine to sit: Total assist;+2 for physical assistance;HOB elevated     General bed mobility comments: Pt OOB in chair  Transfers Overall transfer level: Needs assistance   Transfers: Sit to/from Stand;Squat Pivot Transfers Sit to Stand: Max assist   Squat pivot transfers: Total assist     General transfer comment: Pt total assist +2 to reposition/scoot up in chair    Balance Overall balance assessment: Needs assistance Sitting-balance support: Feet  supported Sitting balance-Leahy Scale: Fair   Postural control: Posterior lean (during standing)   Standing balance-Leahy Scale: Zero                              ADL Overall ADL's : Needs assistance/impaired                                       General ADL Comments: Eval limited due to pts cognitive status, pt unable to follow simple one step commands at this time. Pt is currenlty total assist for ADLs and total assist +2 for functional mobility. Family and close friend present for OT session; family and friend report they notice a decline in pts cognition since hospital admission.      Vision     Perception     Praxis      Pertinent Vitals/Pain Pain Assessment: No/denies pain     Hand Dominance     Extremity/Trunk Assessment Upper Extremity Assessment Upper Extremity Assessment: Generalized weakness   Lower Extremity Assessment Lower Extremity Assessment: Defer to PT evaluation RLE Deficits / Details: Significant tightness preventing full knee ext. LLE Deficits / Details: Significant tightness preventing full knee ext.   Cervical / Trunk Assessment Cervical / Trunk Assessment: Kyphotic   Communication Communication Communication: Other (comment) (pt disoriented to person, place, time, situation. )   Cognition Arousal/Alertness: Lethargic Behavior During Therapy: Flat affect Overall Cognitive Status: Impaired/Different from baseline Area of Impairment:  (Family reports overall decline  in cognition since admission)     Memory: Decreased short-term memory             General Comments       Exercises       Shoulder Instructions      Home Living Family/patient expects to be discharged to:: Skilled nursing facility Living Arrangements: Children Available Help at Discharge: Family;Personal care attendant;Available 24 hours/day Type of Home: House                                  Prior  Functioning/Environment Level of Independence: Needs assistance  Gait / Transfers Assistance Needed: Independent, walked up to 1/2 mile with son PTA ADL's / Homemaking Assistance Needed: Total assist for homemaking, max assist for ADLs pt was able to perform dishwashing, has 24hr assistance at home between sitter and children. She needed assistance with showering or bathing Communication / Swallowing Assistance Needed: Dementia limited cognitive abilities. Dghtr reports that some days she appears completely disoriented while others she does not      OT Diagnosis: Generalized weakness;Acute pain;Cognitive deficits   OT Problem List:     OT Treatment/Interventions:      OT Goals(Current goals can be found in the care plan section) Acute Rehab OT Goals Patient Stated Goal: none stated  OT Frequency:     Barriers to D/C:            Co-evaluation              End of Session    Activity Tolerance: Patient limited by lethargy;Other (comment) (Limited by cognitive status) Patient left: in chair;with call bell/phone within reach;with family/visitor present;with nursing/sitter in room   Time: 6047-9987 OT Time Calculation (min): 13 min Charges:  OT General Charges $OT Visit: 1 Procedure OT Evaluation $Initial OT Evaluation Tier I: 1 Procedure G-Codes:     Binnie Kand M.S., OTR/L Pager: [phone]  10/09/2015, 2:35 PM

## 2015-10-09 NOTE — Progress Notes (Signed)
Patient ID: Diane BoucheMary W Mazurkiewicz, female   DOB: Jun 23, 1926, 81 y.o.   MRN: [id number]    Consult regarding cardiac clearance by Dr Purvis SheffieldKoneswaran reviewed, patient is postop left hp fracture repair. Please call over weekend if questions or concerns.    Dominga FerryJ Cordaro Mukai MD

## 2015-10-09 NOTE — Progress Notes (Signed)
    Subjective: 1 Day Post-Op Procedure(s) (LRB): ANTERIOR APPROACH HEMI HIP ARTHROPLASTY (Left) Patient reports pain as 5 on 0-10 scale.   Denies CP or SOB.  Voiding without difficulty. Positive flatus. Objective: Vital signs in last 24 hours: Temp:  [98 F (36.7 C)-99.5 F (37.5 C)] 98.8 F (37.1 C) (11/26 0458) Pulse Rate:  [84-110] 101 (11/26 0458) Resp:  [14-19] 16 (11/26 0458) BP: (97-124)/(57-70) 100/60 mmHg (11/26 0458) SpO2:  [93 %-100 %] 94 % (11/26 0458)  Intake/Output from previous day: 11/25 0701 - 11/26 0700 In: 1100 [P.O.:300; I.V.:500; IV Piggyback:300] Out: 1350 [Urine:950; Blood:400] Intake/Output this shift:    Labs:  Recent Labs  10/07/15 1205 10/08/15 0325 10/09/15 0354  HGB 11.5* 10.2* 7.9*    Recent Labs  10/08/15 0325 10/09/15 0354  WBC 4.3 7.5  RBC 3.19* 2.39*  HCT 30.3* 22.3*  PLT 103* 86*    Recent Labs  10/08/15 0325 10/09/15 0354  NA 137 135  K 4.3 3.6  CL 107 105  CO2 22 25  BUN 23* 26*  CREATININE 0.82 0.95  GLUCOSE 96 133*  CALCIUM 8.5* 8.1*    Recent Labs  10/07/15 1639  INR 1.09    Physical Exam: ABD soft Incision: dressing C/D/I Compartment soft  Assessment/Plan: 1 Day Post-Op Procedure(s) (LRB): ANTERIOR APPROACH HEMI HIP ARTHROPLASTY (Left) Up with therapy  Patient with confusion and agitation - defer to medical management Wound ok Continue care   Hanny Elsberry D for Dr. Venita Lickahari Brynnan Rodenbaugh [facility] [phone] 10/09/2015, 8:34 AM

## 2015-10-09 NOTE — Progress Notes (Signed)
Patient's buttocks was pink upon examination.  Protocol was initiated, she was cleaned, protective ointment applied along with the sacral mepilex.

## 2015-10-10 DIAGNOSIS — I447 Left bundle-branch block, unspecified: Secondary | ICD-10-CM

## 2015-10-10 DIAGNOSIS — S72002A Fracture of unspecified part of neck of left femur, initial encounter for closed fracture: Secondary | ICD-10-CM

## 2015-10-10 DIAGNOSIS — S0181XD Laceration without foreign body of other part of head, subsequent encounter: Secondary | ICD-10-CM

## 2015-10-10 DIAGNOSIS — F0390 Unspecified dementia without behavioral disturbance: Secondary | ICD-10-CM

## 2015-10-10 DIAGNOSIS — S72002B Fracture of unspecified part of neck of left femur, initial encounter for open fracture type I or II: Secondary | ICD-10-CM

## 2015-10-10 LAB — CBC
HEMATOCRIT: 28.9 % — AB (ref 36.0–46.0)
Hemoglobin: 10.3 g/dL — ABNORMAL LOW (ref 12.0–15.0)
MCH: 32.9 pg (ref 26.0–34.0)
MCHC: 35.6 g/dL (ref 30.0–36.0)
MCV: 92.3 fL (ref 78.0–100.0)
Platelets: 109 10*3/uL — ABNORMAL LOW (ref 150–400)
RBC: 3.13 MIL/uL — ABNORMAL LOW (ref 3.87–5.11)
RDW: 14.7 % (ref 11.5–15.5)
WBC: 8.4 10*3/uL (ref 4.0–10.5)

## 2015-10-10 LAB — BASIC METABOLIC PANEL
Anion gap: 7 (ref 5–15)
BUN: 30 mg/dL — ABNORMAL HIGH (ref 6–20)
CALCIUM: 8.5 mg/dL — AB (ref 8.9–10.3)
CO2: 27 mmol/L (ref 22–32)
CREATININE: 0.92 mg/dL (ref 0.44–1.00)
Chloride: 106 mmol/L (ref 101–111)
GFR calc non Af Amer: 54 mL/min — ABNORMAL LOW (ref >60)
Glucose, Bld: 118 mg/dL — ABNORMAL HIGH (ref 65–99)
Potassium: 3.9 mmol/L (ref 3.5–5.1)
Sodium: 140 mmol/L (ref 135–145)

## 2015-10-10 LAB — TYPE AND SCREEN
ABO/RH(D): O POS
ANTIBODY SCREEN: NEGATIVE
Unit division: 0

## 2015-10-10 MED ORDER — BACITRACIN-NEOMYCIN-POLYMYXIN OINTMENT TUBE: 1.0000 "application " | TOPICAL_OINTMENT | Freq: Two times a day (BID) | CUTANEOUS | Status: AC

## 2015-10-10 MED ORDER — BOOST / RESOURCE BREEZE PO LIQD: 1.0000 | Freq: Three times a day (TID) | ORAL | Status: AC

## 2015-10-10 MED ORDER — SENNA 8.6 MG PO TABS: 1.0000 | ORAL_TABLET | Freq: Two times a day (BID) | ORAL | Status: AC

## 2015-10-10 MED ORDER — ASPIRIN 325 MG PO TBEC
325.0000 mg | DELAYED_RELEASE_TABLET | Freq: Two times a day (BID) | ORAL | Status: DC
Start: 2015-10-10 — End: 2015-10-11

## 2015-10-10 MED ORDER — OXYCODONE HCL 5 MG PO TABS: 5.0000 mg | ORAL_TABLET | Freq: Four times a day (QID) | ORAL | Status: DC | PRN

## 2015-10-10 NOTE — Progress Notes (Signed)
Subjective: 2 Days Post-Op Procedure(s) (LRB): ANTERIOR APPROACH HEMI HIP ARTHROPLASTY (Left) Patient resting in bed. No complaints at this time. Hx of confusion and agitation, improved today.   Objective: Vital signs in last 24 hours: Temp:  [98 F (36.7 C)-98.8 F (37.1 C)] 98 F (36.7 C) (11/27 0533) Pulse Rate:  [91-115] 105 (11/27 0533) Resp:  [16-17] 16 (11/27 0533) BP: (97-131)/(56-70) 131/56 mmHg (11/27 0533) SpO2:  [95 %-98 %] 95 % (11/27 0533)  Intake/Output from previous day: 11/26 0701 - 11/27 0700 In: 695 [P.O.:360; Blood:335] Out: -  Intake/Output this shift:     Recent Labs  10/07/15 1205 10/08/15 0325 10/09/15 0354 10/09/15 1630 10/10/15 0356  HGB 11.5* 10.2* 7.9* 10.1* 10.3*    Recent Labs  10/09/15 1630 10/10/15 0356  WBC 8.7 8.4  RBC 3.13* 3.13*  HCT 28.7* 28.9*  PLT 91* 109*    Recent Labs  10/09/15 0354 10/10/15 0356  NA 135 140  K 3.6 3.9  CL 105 106  CO2 25 27  BUN 26* 30*  CREATININE 0.95 0.92  GLUCOSE 133* 118*  CALCIUM 8.1* 8.5*    Recent Labs  10/07/15 1639  INR 1.09    Left Calf soft and non tender. L hip dressing C/D/I. No DVT signs. No signs of infection or compartment syndrome. LLE grossly neurovascularly intact.   Assessment/Plan: 2 Days Post-Op Procedure(s) (LRB): ANTERIOR APPROACH HEMI HIP ARTHROPLASTY (Left) Up with PT Medicine following WBAT with walker On ASA 325 bid Diane Santana L 10/10/2015, 10:12 AM

## 2015-10-10 NOTE — NC FL2 (Signed)
[place] MEDICAID FL2 LEVEL OF CARE SCREENING TOOL     IDENTIFICATION  Patient Name: Diane Santana Birthdate: 1926/02/01 Sex: female Admission Date (Current Location): 10/07/2015  [facility] [place]   Facility and Address:  [facility], [address], [place], [place] [address]      Provider Number: [id number]  Attending Physician Name and Address:  Leroy SeaPrashant K Singh, MD  Relative Name and Phone Number:       Current Level of Care: SNF Recommended Level of Care: Skilled Nursing Facility Prior Approval Number:    Date Approved/Denied:   PASRR Number:    Discharge Plan: SNF    Current Diagnoses: Patient Active Problem List   Diagnosis Date Noted  . Displaced fracture of left femoral neck (HCC) 10/08/2015  . Protein-calorie malnutrition, severe 10/08/2015  . Hip fracture (HCC) 10/07/2015  . Facial laceration 10/07/2015  . LBBB (left bundle branch block) 10/07/2015  . Thrombocytopenia (HCC) 10/07/2015  . Femoral neck fracture, left, closed, initial encounter 10/07/2015  . Hip fracture, left (HCC)   . Fecal incontinence 07/08/2015  . Risk for falls 06/21/2015  . Anemia 04/30/2014  . Hoarseness 04/30/2014  . Severe dementia 10/06/2013  . Eustachian tube disorder 10/06/2013  . Plaque psoriasis 10/06/2013  . Loss of weight 10/06/2013  . Urinary incontinence 10/06/2013  . Hard of hearing 10/06/2013    Orientation ACTIVITIES/SOCIAL BLADDER RESPIRATION    Self  Passive Continent Normal  BEHAVIORAL SYMPTOMS/MOOD NEUROLOGICAL BOWEL NUTRITION STATUS      Continent  (dysphagia 1)  PHYSICIAN VISITS COMMUNICATION OF NEEDS Height & Weight Skin    Verbally   89 lbs. Normal          AMBULATORY STATUS RESPIRATION    Assist extensive Normal      Personal Care Assistance Level of Assistance  Bathing, Dressing Bathing Assistance: Limited assistance   Dressing Assistance: Limited assistance      Functional Limitations  Info                SPECIAL CARE FACTORS FREQUENCY                      Additional Factors Info                  Current Medications (10/10/2015): Current Facility-Administered Medications  Medication Dose Route Frequency Provider Last Rate Last Dose  . acetaminophen (TYLENOL) tablet 1,000 mg  1,000 mg Oral 3 times per day Maretta BeesShanker M Ghimire, MD   1,000 mg at 10/10/15 0615  . aspirin EC tablet 325 mg  325 mg Oral BID PC Samson FredericBrian Swinteck, MD   325 mg at 10/09/15 1848  . famotidine (PEPCID) tablet 20 mg  20 mg Oral BID Samson FredericBrian Swinteck, MD   20 mg at 10/09/15 2137  . feeding supplement (BOOST / RESOURCE BREEZE) liquid 1 Container  1 Container Oral TID BM Stana BuntingJenifer A Williams, RD   1 Container at 10/09/15 2137  . Influenza vac split quadrivalent PF (FLUARIX) injection 0.5 mL  0.5 mL Intramuscular Prior to discharge Maretta BeesShanker M Ghimire, MD      . menthol-cetylpyridinium (CEPACOL) lozenge 3 mg  1 lozenge Oral PRN Samson FredericBrian Swinteck, MD       Or  . phenol (CHLORASEPTIC) mouth spray 1 spray  1 spray Mouth/Throat PRN Samson FredericBrian Swinteck, MD      . metoCLOPramide (REGLAN) tablet 5-10 mg  5-10 mg Oral Q8H PRN Samson FredericBrian Swinteck, MD  Or  . metoCLOPramide (REGLAN) injection 5-10 mg  5-10 mg Intravenous Q8H PRN [name], MD      . morphine 2 MG/ML injection 0.5-1 mg  0.5-1 mg Intravenous Q2H PRN Stephani Police, PA-C   0.5 mg at 10/08/15 1957  . neomycin-bacitracin-polymyxin (NEOSPORIN) ointment   Topical BID Maretta Bees, MD      . ondansetron [facility]) tablet 4 mg  4 mg Oral Q6H PRN [name], MD       Or  . ondansetron [facility]) injection 4 mg  4 mg Intravenous Q6H PRN [name], MD      . oxyCODONE (Oxy IR/ROXICODONE) immediate release tablet 5 mg  5 mg Oral Q6H PRN Maretta Bees, MD   5 mg at 10/08/15 1809  . pneumococcal 23 valent vaccine (PNU-IMMUNE) injection 0.5 mL  0.5 mL Intramuscular Prior to discharge Maretta Bees, MD      . senna [facility]) tablet 8.6 mg  1  tablet Oral BID [name], MD   8.6 mg at 10/09/15 2137  . sodium phosphate (FLEET) 7-19 GM/118ML enema 1 enema  1 enema Rectal Once PRN [name], MD      . sorbitol 70 % solution 30 mL  30 mL Oral Daily PRN [name], MD       Do not use this list as official medication orders. Please verify with discharge summary.  Discharge Medications:   Medication List    TAKE these medications        3-IN-1 BEDSIDE TOILET Misc  DME 3-in- 1 bedside toilet. DDX code: Z91.81, R32, F03.90, R15.9     acetaminophen 500 MG tablet  Commonly known as:  TYLENOL  Take 500 mg by mouth every 6 (six) hours as needed for mild pain.     aspirin 325 MG EC tablet  Take 1 tablet (325 mg total) by mouth 2 (two) times daily after a meal.     oxyCODONE 5 MG immediate release tablet  Commonly known as:  Oxy IR/ROXICODONE  Take 1 tablet (5 mg total) by mouth every 6 (six) hours as needed for moderate pain.     senna 8.6 MG Tabs tablet  Commonly known as:  SENOKOT  Take 1 tablet (8.6 mg total) by mouth 2 (two) times daily.        Relevant Imaging Results:  Relevant Lab Results:  Recent Labs    Additional Information    Lebron Quam, LCSW

## 2015-10-10 NOTE — Plan of Care (Signed)
Nursing home of family's choice not willing to take patient over the weekend, will be transferred tomorrow. Discussed with social work. 

## 2015-10-10 NOTE — Clinical Social Work Placement (Signed)
   CLINICAL SOCIAL WORK PLACEMENT  NOTE  Date:  10/10/2015  Patient Details  Name: Diane Santana MRN: [id number] Date of Birth: 27-May-1926  Clinical Social Work is seeking post-discharge placement for this patient at the Skilled  Nursing Facility level of care (*CSW will initial, date and re-position this form in  chart as items are completed):  Yes   Patient/family provided with [facility] Clinical Social Work Department's list of facilities offering this level of care within the geographic area requested by the patient (or if unable, by the patient's family).  Yes   Patient/family informed of their freedom to choose among providers that offer the needed level of care, that participate in Medicare, Medicaid or managed care program needed by the patient, have an available bed and are willing to accept the patient.  Yes   Patient/family informed of [facility]'s ownership interest in [facility] and [facility], as well as of the fact that they are under no obligation to receive care at these facilities.  PASRR submitted to EDS on       PASRR number received on       Existing PASRR number confirmed on       FL2 transmitted to all facilities in geographic area requested by pt/family on 10/10/15     FL2 transmitted to all facilities within larger geographic area on       Patient informed that his/her managed care company has contracts with or will negotiate with certain facilities, including the following:            Patient/family informed of bed offers received.  Patient chooses bed at       Physician recommends and patient chooses bed at      Patient to be transferred to   on  .  Patient to be transferred to facility by       Patient family notified on   of transfer.  Name of family member notified:        PHYSICIAN       Additional Comment:    _______________________________________________ Lebron QuamSimpson, Minal Stuller Amanda, LCSW 10/10/2015, 9:13 AM

## 2015-10-10 NOTE — Discharge Summary (Addendum)
Diane Santana, is a 81 y.o. female  DOB 1926-05-19  MRN [id number].  Admission date:  10/07/2015  Admitting Physician  Alba Cory, MD  Discharge Date:  10/11/2015   Primary MD  Felix Pacini, DO  Recommendations for primary care physician for things to follow:   Advanced dementia, goal of care gentle medical treatment. She is DO NOT RESUSCITATE. If declines further consider hospice. Not a candidate for heroic measures.  Check CBC BMP in a week.   Admission Diagnosis  Pain [R52] Fall [W19.XXXA] Hip fracture, left, closed, initial encounter [facility]) [S72.002A] Displaced fracture of left femoral neck, closed, initial encounter (HCC) [S72.002A]   Discharge Diagnosis  Pain [R52] Fall [W19.XXXA] Hip fracture, left, closed, initial encounter (HCC) [S72.002A] Displaced fracture of left femoral neck, closed, initial encounter (HCC) [S72.002A]    Principal Problem:   Hip fracture (HCC) Active Problems:   Severe dementia   Urinary incontinence   Facial laceration   LBBB (left bundle branch block)   Thrombocytopenia (HCC)   Femoral neck fracture, left, closed, initial encounter   Displaced fracture of left femoral neck (HCC)   Protein-calorie malnutrition, severe      Past Medical History  Diagnosis Date  . Acute meniscal tear of knee     left knee  . Urgency of urination   . Nocturia   . Dementia     Past Surgical History  Procedure Laterality Date  . Cataract extraction w/ intraocular lens  implant, bilateral    . Dilation and curettage of uterus  2004 (APPROX)  . Knee arthroscopy with lateral menisectomy Left 03/04/2013    Procedure: LEFT KNEE ARTHROSCOPY PARTIAL MEDIAL AND LATERAL   MENISCECTOMY;  Surgeon: Drucilla Schmidt, MD;  Location:  SURGERY CENTER;  Service: Orthopedics;   Laterality: Left;       HPI  from the history and physical done on the day of admission:    Diane Santana is a 81 y.o. female, on no medications, with dementia presents from home after a fall. Her son Gerlene Burdock, provides the history as the patient is demented. Normally the patient walks about home with no difficulty. But yesterday she seemed to be limping, favoring her left side. She needed significant assistance to get out of chairs. This morning while she was standing in the bathroom trying to wash her face she experienced an unwitnessed fall. Her son heard a thud and immediately came to find her. The patient currently denies chest pain, recent illness, difficulty breathing. Per her son she has been feeling well recently, mostly independent with her ADLs, and eating well.  In the ER x-rays of her hip show a closed left femoral neck fracture. Labs are reassuring with the exception of platelets of 124. Dr. Victorino Dike was called for evaluation and is tentatively planning surgical repair in the morning on 11/25. Radiology has been called for cardiac clearance.     Hospital Course:    Left Hip fracture: Suspect from a mechanical fall. Seen by orthopedics, underwent left hip  hemiarthroplasty on 11/25. Recommendations from orthopedics weightbearing as tolerated with a walker, aspirin 325 mg twice daily for DVT prophylaxis. PT evaluation completed, SNF recommended-and she will be discharged to SNF.  Anemia: Likely acute postoperative blood loss received 1 unit of packed RBC, posttransfusion H&H stable.  Dementia with delirium: Has developed a mild delirium-secondary to pain/narcotics-supportive care. Continue to minimize narcotics/sedatives as much as possible. Remains high risk for fall and aspiration. Fall and aspiration precautions, feeding assistance.  Prolonged QTC: Likely not reliable-in a setting of left bundle branch block. Monitor  LBBB: Chronic issue-both cardiology note-this was seen on a  prior EKG on 06/17/15  Chronic systolic heart failure (EF by echo 40-50% on 11/25): No regional wall motion abnormalities, clinically compensated-suspect given advanced age/dementia no further workup required.   Small left facial skin tear: Appreciate wound care, she received TdaP, antibiotic cream twice daily to the area. Local wound care.  Protein-calorie malnutrition, severe: Continue supplements, appreciate nutrition evaluation    Discharge Condition: Fair  Follow UP  Follow-up Information    Follow up with Swinteck, [name], MD. Schedule an appointment as soon as possible for a visit in 2 weeks.   Specialty:  Orthopedic Surgery   Why:  For wound re-check   Contact information:   [address] [place] [address] [phone]       Follow up with Felix Pacini, DO. Schedule an appointment as soon as possible for a visit in 1 week.   Specialty:  Family Medicine   Contact information:   [address] [place] [address] [phone]        Consults obtained - Ortho  Diet and Activity recommendation: See Discharge Instructions below  Discharge Instructions           Discharge Instructions    Discharge instructions    Complete by:  As directed   Follow with Primary MD Felix Pacini, DO in 7 days   Get CBC, CMP, 2 view Chest X ray checked  by Primary MD next visit.    Activity: Weightbearing as tolerated with Full fall precautions use walker/cane & assistance as needed   Disposition SNF   Diet: Dysphagia 1 Nectar Thick with feeding assistance and aspiration precautions.  For Heart failure patients - Check your Weight same time everyday, if you gain over 2 pounds, or you develop in leg swelling, experience more shortness of breath or chest pain, call your Primary MD immediately. Follow Cardiac Low Salt Diet and 1.5 lit/day fluid restriction.   On your next visit with your primary care physician please Get Medicines reviewed and  adjusted.   Please request your Prim.MD to go over all Hospital Tests and Procedure/Radiological results at the follow up, please get all Hospital records sent to your Prim MD by signing hospital release before you go home.   If you experience worsening of your admission symptoms, develop shortness of breath, life threatening emergency, suicidal or homicidal thoughts you must seek medical attention immediately by calling 911 or calling your MD immediately  if symptoms less severe.  You Must read complete instructions/literature along with all the possible adverse reactions/side effects for all the Medicines you take and that have been prescribed to you. Take any new Medicines after you have completely understood and accpet all the possible adverse reactions/side effects.   Do not drive, operating heavy machinery, perform activities at heights, swimming or participation in water activities or provide baby sitting services if your were admitted for syncope  or siezures until you have seen by Primary MD or a Neurologist and advised to do so again.  Do not drive when taking Pain medications.    Do not take more than prescribed Pain, Sleep and Anxiety Medications  Special Instructions: If you have smoked or chewed Tobacco  in the last 2 yrs please stop smoking, stop any regular Alcohol  and or any Recreational drug use.  Wear Seat belts while driving.   Please note  You were cared for by a hospitalist during your hospital stay. If you have any questions about your discharge medications or the care you received while you were in the hospital after you are discharged, you can call the unit and asked to speak with the hospitalist on call if the hospitalist that took care of you is not available. Once you are discharged, your primary care physician will handle any further medical issues. Please note that NO REFILLS for any discharge medications will be authorized once you are discharged, as it is  imperative that you return to your primary care physician (or establish a relationship with a primary care physician if you do not have one) for your aftercare needs so that they can reassess your need for medications and monitor your lab values.     Increase activity slowly    Complete by:  As directed              Discharge Medications       Medication List    TAKE these medications        3-IN-1 BEDSIDE TOILET Misc  DME 3-in- 1 bedside toilet. DDX code: Z91.81, R32, F03.90, R15.9     acetaminophen 500 MG tablet  Commonly known as:  TYLENOL  Take 500 mg by mouth every 6 (six) hours as needed for mild pain.     aspirin 325 MG EC tablet  Take 1 tablet (325 mg total) by mouth 2 (two) times daily after a meal.     feeding supplement Liqd  Take 1 Container by mouth 3 (three) times daily between meals.     neomycin-bacitracin-polymyxin Oint  Commonly known as:  NEOSPORIN  Apply 1 application topically 2 (two) times daily.     oxyCODONE 5 MG immediate release tablet  Commonly known as:  Oxy IR/ROXICODONE  Take 1 tablet (5 mg total) by mouth every 6 (six) hours as needed for moderate pain.     senna 8.6 MG Tabs tablet  Commonly known as:  SENOKOT  Take 1 tablet (8.6 mg total) by mouth 2 (two) times daily.        Major procedures and Radiology Reports - PLEASE review detailed and final reports for all details, in brief -     Ct Head Wo Contrast  10/07/2015  CLINICAL DATA:  Pain following fall EXAM: CT HEAD WITHOUT CONTRAST CT MAXILLOFACIAL WITHOUT CONTRAST CT CERVICAL SPINE WITHOUT CONTRAST TECHNIQUE: Multidetector CT imaging of the head, cervical spine, and maxillofacial structures were performed using the standard protocol without intravenous contrast. Multiplanar CT image reconstructions of the cervical spine and maxillofacial structures were also generated. COMPARISON:  Head CT June 17, 2015 FINDINGS: CT HEAD FINDINGS Mild diffuse atrophy is stable. There is no  intracranial mass, hemorrhage, extra-axial fluid collection, or midline shift. There is small vessel disease throughout the centra semiovale bilaterally, stable. No new gray-white compartment lesions are identified. No acute infarct evident. A small calcification in the periphery of the mid left occipital lobe is stable in may represent  a tiny granuloma. The bony calvarium appears intact. The mastoid air cells are clear. CT MAXILLOFACIAL FINDINGS There is no fracture or dislocation. The orbits appear symmetric and normal bilaterally. There are no demonstrable intraorbital lesions. There is opacification of a superior anterior right ethmoid air cell. Other paranasal sinuses are clear. There is no air-fluid level. No bony destruction or expansion. The ostiomeatal unit complexes appear patent bilaterally. There is edema of the right inferior nasal turbinate with narrowing of the right naris. There is no nares obstruction. There is rightward deviation of the nasal septum. Salivary glands appear unremarkable. Atherosclerotic changes noted in both carotid arteries. No adenopathy is appreciable. There is arthropathy in both temporomandibular joints. CT CERVICAL SPINE FINDINGS There is no fracture. Minimal retrolisthesis of C5 on C6 is felt to be due to underlying spondylosis. There is no other spondylolisthesis. Prevertebral soft tissues and predental space regions are normal. There is facet hypertrophy at multiple levels bilaterally. There is exit foraminal narrowing at multiple levels, most pronounced on the left at C3-4, on the left at C4-5, at C5-6 bilaterally, more severe on the right than on the left, and C6-7 bilaterally, more severe on the right than on the left. There is no frank disc extrusion or high-grade stenosis. There is calcification in each carotid artery. There are multiple nodular lesions in the thyroid consistent with multinodular goiter. IMPRESSION: CT head: Atrophy with supratentorial small vessel  disease, stable. No acute infarct evident. No mass, hemorrhage, or extra-axial fluid collection. CT maxillofacial: No fracture or dislocation. No intraorbital lesions. Mild right ethmoid sinus disease. Other paranasal sinuses are clear. There is edema of the inferior right nasal turbinate with nares narrowing on the right. Rightward deviation nasal septum. Temporomandibular joint osteoarthritic change bilaterally. Calcification in each carotid artery. CT cervical spine: Multilevel osteoarthritic change. No fracture. Minimal spondylolisthesis at C5-6 is felt to be due to underlying spondylosis. No other spondylolisthesis. Calcification noted in each carotid artery. Evidence of multinodular goiter. Consider further evaluation with thyroid ultrasound. If patient is clinically hyperthyroid, consider nuclear medicine thyroid uptake and scan. Electronically Signed   By: Bretta Bang III M.D.   On: 10/07/2015 13:37   Ct Cervical Spine Wo Contrast  10/07/2015  CLINICAL DATA:  Pain following fall EXAM: CT HEAD WITHOUT CONTRAST CT MAXILLOFACIAL WITHOUT CONTRAST CT CERVICAL SPINE WITHOUT CONTRAST TECHNIQUE: Multidetector CT imaging of the head, cervical spine, and maxillofacial structures were performed using the standard protocol without intravenous contrast. Multiplanar CT image reconstructions of the cervical spine and maxillofacial structures were also generated. COMPARISON:  Head CT June 17, 2015 FINDINGS: CT HEAD FINDINGS Mild diffuse atrophy is stable. There is no intracranial mass, hemorrhage, extra-axial fluid collection, or midline shift. There is small vessel disease throughout the centra semiovale bilaterally, stable. No new gray-white compartment lesions are identified. No acute infarct evident. A small calcification in the periphery of the mid left occipital lobe is stable in may represent a tiny granuloma. The bony calvarium appears intact. The mastoid air cells are clear. CT MAXILLOFACIAL FINDINGS  There is no fracture or dislocation. The orbits appear symmetric and normal bilaterally. There are no demonstrable intraorbital lesions. There is opacification of a superior anterior right ethmoid air cell. Other paranasal sinuses are clear. There is no air-fluid level. No bony destruction or expansion. The ostiomeatal unit complexes appear patent bilaterally. There is edema of the right inferior nasal turbinate with narrowing of the right naris. There is no nares obstruction. There is rightward deviation of the nasal  septum. Salivary glands appear unremarkable. Atherosclerotic changes noted in both carotid arteries. No adenopathy is appreciable. There is arthropathy in both temporomandibular joints. CT CERVICAL SPINE FINDINGS There is no fracture. Minimal retrolisthesis of C5 on C6 is felt to be due to underlying spondylosis. There is no other spondylolisthesis. Prevertebral soft tissues and predental space regions are normal. There is facet hypertrophy at multiple levels bilaterally. There is exit foraminal narrowing at multiple levels, most pronounced on the left at C3-4, on the left at C4-5, at C5-6 bilaterally, more severe on the right than on the left, and C6-7 bilaterally, more severe on the right than on the left. There is no frank disc extrusion or high-grade stenosis. There is calcification in each carotid artery. There are multiple nodular lesions in the thyroid consistent with multinodular goiter. IMPRESSION: CT head: Atrophy with supratentorial small vessel disease, stable. No acute infarct evident. No mass, hemorrhage, or extra-axial fluid collection. CT maxillofacial: No fracture or dislocation. No intraorbital lesions. Mild right ethmoid sinus disease. Other paranasal sinuses are clear. There is edema of the inferior right nasal turbinate with nares narrowing on the right. Rightward deviation nasal septum. Temporomandibular joint osteoarthritic change bilaterally. Calcification in each carotid  artery. CT cervical spine: Multilevel osteoarthritic change. No fracture. Minimal spondylolisthesis at C5-6 is felt to be due to underlying spondylosis. No other spondylolisthesis. Calcification noted in each carotid artery. Evidence of multinodular goiter. Consider further evaluation with thyroid ultrasound. If patient is clinically hyperthyroid, consider nuclear medicine thyroid uptake and scan. Electronically Signed   By: Bretta BangWilliam  Woodruff III M.D.   On: 10/07/2015 13:37   Pelvis Portable  10/08/2015  CLINICAL DATA:  Postop left hip replacement. EXAM: PORTABLE PELVIS 1-2 VIEWS COMPARISON:  Preoperative hip radiographs 10/07/2015. FINDINGS: A left hip hemiarthroplasty is noted. AP views demonstrate normal positioning. No radiographically evident complication is present. Degenerative changes are noted in the lower lumbar spine. IMPRESSION: 1. Left hip hemiarthroplasty without radiographic evidence for complication based on the AP imaging. Electronically Signed   By: Marin Robertshristopher  Mattern M.D.   On: 10/08/2015 10:17   Dg Chest Port 1 View  10/11/2015  CLINICAL DATA:  Cough. EXAM: PORTABLE CHEST 1 VIEW COMPARISON:  October 07, 2015. FINDINGS: Stable cardiomediastinal silhouette. No pneumothorax or pleural effusion is noted. Mild levoscoliosis of lower thoracic spine is noted. Osteophyte formation is seen in the lower thoracic spine is well. No acute pulmonary disease is noted. IMPRESSION: No acute cardiopulmonary abnormality seen. Electronically Signed   By: Lupita RaiderJames  Green Jr, M.D.   On: 10/11/2015 08:31   Dg Chest Port 1 View  10/07/2015  CLINICAL DATA:  Preop.  Hip fracture. EXAM: PORTABLE CHEST 1 VIEW COMPARISON:  None. FINDINGS: Upper normal heart size. Clear lungs. No pneumothorax. No obvious bony deformity. Osteopenia. IMPRESSION: No active disease. Electronically Signed   By: Jolaine ClickArthur  Hoss M.D.   On: 10/07/2015 14:36   Dg Knee Left Port  10/07/2015  CLINICAL DATA:  Pain following fall EXAM:  PORTABLE LEFT KNEE - 1-2 VIEW COMPARISON:  None. FINDINGS: Frontal and lateral views were obtained. There is no fracture or dislocation. No appreciable joint effusion. There is mid mild generalized joint space narrowing. There is spurring medially and arising from the patella. No erosive change. IMPRESSION: Areas of osteoarthritic change.  No fracture or effusion. Electronically Signed   By: Bretta BangWilliam  Woodruff III M.D.   On: 10/07/2015 19:27   Dg Hip Operative Unilat With Pelvis Left  10/08/2015  CLINICAL DATA:  Left hip arthroplasty.  EXAM: OPERATIVE LEFT HIP (WITH PELVIS IF PERFORMED) FRONTAL VIEWS TECHNIQUE: Fluoroscopic spot image(s) were submitted for interpretation post-operatively. COMPARISON:  None. FINDINGS: Three fluoroscopic images of the pelvis/left hip demonstrate left hip replacement. The surgical hardware is in good position with anatomic alignment of the left hip. There is no evidence of immediate complications. Fluoroscopy time is recorded as 9 seconds. IMPRESSION: Intraoperative images from left hip replacement. Electronically Signed   By: Ted Mcalpine M.D.   On: 10/08/2015 09:26   Dg Hips Bilat With Pelvis Min 5 Views  10/07/2015  CLINICAL DATA:  Pain following fall EXAM: DG HIP (WITH OR WITHOUT PELVIS) 5+V BILAT COMPARISON:  None. FINDINGS: Frontal pelvis as well as frontal lateral views of each hip obtained. There is evidence of an impaction type injury at the subcapital femoral neck level on the left. There is no appreciable displacement of fracture fragments. There is no other fracture apparent. No dislocation. There is mild symmetric narrowing of both hip joints. Bones are osteoporotic. There is degenerative change in the visualized lumbar spine. IMPRESSION: Evidence of impaction type fracture in the subcapital femoral neck region on the left. No other evidence of fracture. No dislocation. Symmetric narrowing both hip joints. Bones osteoporotic. Electronically Signed   By:  Bretta Bang III M.D.   On: 10/07/2015 13:51   Ct Maxillofacial Wo Cm  10/07/2015  CLINICAL DATA:  Pain following fall EXAM: CT HEAD WITHOUT CONTRAST CT MAXILLOFACIAL WITHOUT CONTRAST CT CERVICAL SPINE WITHOUT CONTRAST TECHNIQUE: Multidetector CT imaging of the head, cervical spine, and maxillofacial structures were performed using the standard protocol without intravenous contrast. Multiplanar CT image reconstructions of the cervical spine and maxillofacial structures were also generated. COMPARISON:  Head CT June 17, 2015 FINDINGS: CT HEAD FINDINGS Mild diffuse atrophy is stable. There is no intracranial mass, hemorrhage, extra-axial fluid collection, or midline shift. There is small vessel disease throughout the centra semiovale bilaterally, stable. No new gray-white compartment lesions are identified. No acute infarct evident. A small calcification in the periphery of the mid left occipital lobe is stable in may represent a tiny granuloma. The bony calvarium appears intact. The mastoid air cells are clear. CT MAXILLOFACIAL FINDINGS There is no fracture or dislocation. The orbits appear symmetric and normal bilaterally. There are no demonstrable intraorbital lesions. There is opacification of a superior anterior right ethmoid air cell. Other paranasal sinuses are clear. There is no air-fluid level. No bony destruction or expansion. The ostiomeatal unit complexes appear patent bilaterally. There is edema of the right inferior nasal turbinate with narrowing of the right naris. There is no nares obstruction. There is rightward deviation of the nasal septum. Salivary glands appear unremarkable. Atherosclerotic changes noted in both carotid arteries. No adenopathy is appreciable. There is arthropathy in both temporomandibular joints. CT CERVICAL SPINE FINDINGS There is no fracture. Minimal retrolisthesis of C5 on C6 is felt to be due to underlying spondylosis. There is no other spondylolisthesis.  Prevertebral soft tissues and predental space regions are normal. There is facet hypertrophy at multiple levels bilaterally. There is exit foraminal narrowing at multiple levels, most pronounced on the left at C3-4, on the left at C4-5, at C5-6 bilaterally, more severe on the right than on the left, and C6-7 bilaterally, more severe on the right than on the left. There is no frank disc extrusion or high-grade stenosis. There is calcification in each carotid artery. There are multiple nodular lesions in the thyroid consistent with multinodular goiter. IMPRESSION: CT head: Atrophy with supratentorial small  vessel disease, stable. No acute infarct evident. No mass, hemorrhage, or extra-axial fluid collection. CT maxillofacial: No fracture or dislocation. No intraorbital lesions. Mild right ethmoid sinus disease. Other paranasal sinuses are clear. There is edema of the inferior right nasal turbinate with nares narrowing on the right. Rightward deviation nasal septum. Temporomandibular joint osteoarthritic change bilaterally. Calcification in each carotid artery. CT cervical spine: Multilevel osteoarthritic change. No fracture. Minimal spondylolisthesis at C5-6 is felt to be due to underlying spondylosis. No other spondylolisthesis. Calcification noted in each carotid artery. Evidence of multinodular goiter. Consider further evaluation with thyroid ultrasound. If patient is clinically hyperthyroid, consider nuclear medicine thyroid uptake and scan. Electronically Signed   By: Bretta Bang III M.D.   On: 10/07/2015 13:37    Micro Results      Recent Results (from the past 240 hour(s))  Surgical pcr screen     Status: None   Collection Time: 10/07/15  6:08 PM  Result Value Ref Range Status   MRSA, PCR NEGATIVE NEGATIVE Final   Staphylococcus aureus NEGATIVE NEGATIVE Final    Comment:        The Xpert SA Assay (FDA approved for NASAL specimens in patients over 51 years of age), is one component of a  comprehensive surveillance program.  Test performance has been validated by [facility] for patients greater than or equal to 18 year old. It is not intended to diagnose infection nor to guide or monitor treatment.        Today   Subjective    Diane Santana today in bed, has advanced dementia and reliable historian, does not appear to be in any distress, says no to all questions.   Objective   Blood pressure 122/66, pulse 89, temperature 97.6 F (36.4 C), temperature source Axillary, resp. rate 16, weight 40.37 kg (89 lb), SpO2 96 %.  No intake or output data in the 24 hours ending 10/11/15 0950  Exam Awake but confused, No new F.N deficits, Normal affect [place].AT,PERRAL Supple Neck,No JVD, No cervical lymphadenopathy appriciated.  Symmetrical Chest wall movement, Good air movement bilaterally, CTAB RRR,No Gallops,Rubs or new Murmurs, No Parasternal Heave +ve B.Sounds, Abd Soft, Non tender, No organomegaly appriciated, No rebound -guarding or rigidity. No Cyanosis, Clubbing or edema, No new Rash or bruise, left hip scar stable   Data Review   CBC w Diff:  Lab Results  Component Value Date   WBC 5.7 10/11/2015   HGB 9.2* 10/11/2015   HCT 26.9* 10/11/2015   PLT 122* 10/11/2015   LYMPHOPCT 24 06/17/2015   MONOPCT 6 06/17/2015   EOSPCT 0 06/17/2015   BASOPCT 0 06/17/2015    CMP:  Lab Results  Component Value Date   NA 140 10/10/2015   K 3.9 10/10/2015   CL 106 10/10/2015   CO2 27 10/10/2015   BUN 30* 10/10/2015   CREATININE 0.92 10/10/2015   CREATININE 0.85 10/14/2013   PROT 6.1* 06/17/2015   ALBUMIN 3.5 06/17/2015   BILITOT 0.6 06/17/2015   ALKPHOS 82 06/17/2015   AST 46* 06/17/2015   ALT 29 06/17/2015  .   Total Time in preparing paper work, data evaluation and todays exam - 35 minutes  Leroy Sea M.D on 10/11/2015 at 9:50 AM  [facility]   Office  [phone]

## 2015-10-10 NOTE — Clinical Social Work Placement (Signed)
   CLINICAL SOCIAL WORK PLACEMENT  NOTE  Date:  10/10/2015  Patient Details  Name: Diane Santana MRN: [id number] Date of Birth: 16-May-1926  Clinical Social Work is seeking post-discharge placement for this patient at the Skilled  Nursing Facility level of care (*CSW will initial, date and re-position this form in  chart as items are completed):  Yes   Patient/family provided with [facility] Clinical Social Work Department's list of facilities offering this level of care within the geographic area requested by the patient (or if unable, by the patient's family).  Yes   Patient/family informed of their freedom to choose among providers that offer the needed level of care, that participate in Medicare, Medicaid or managed care program needed by the patient, have an available bed and are willing to accept the patient.  Yes   Patient/family informed of [facility] ownership interest in [facility] and [facility], as well as of the fact that they are under no obligation to receive care at these facilities.  PASRR submitted to EDS on 10/10/15     PASRR number received on 10/10/15     Existing PASRR number confirmed on       FL2 transmitted to all facilities in geographic area requested by pt/family on 10/10/15     FL2 transmitted to all facilities within larger geographic area on       Patient informed that his/her managed care company has contracts with or will negotiate with certain facilities, including the following:            Patient/family informed of bed offers received.  Patient chooses bed at       Physician recommends and patient chooses bed at      Patient to be transferred to   on  .  Patient to be transferred to facility by       Patient family notified on   of transfer.  Name of family member notified:        PHYSICIAN       Additional Comment:    _______________________________________________ Lebron QuamSimpson, Lasonia Casino Amanda, LCSW 10/10/2015, 9:31  AM

## 2015-10-10 NOTE — Clinical Social Work Note (Signed)
Clinical Social Work Assessment  Patient Details  Name: Diane Santana MRN: [id number] Date of Birth: 1926-01-15  Date of referral:  10/10/15               Reason for consult:  Facility Placement                Permission sought to share information with:  Family Supports Permission granted to share information::  Yes, Verbal Permission Granted  Name::     Diane Santana  Agency::     Relationship::  daughter  Contact Information:  [phone]  Housing/Transportation Living arrangements for the past 2 months:  Single Family Home Source of Information:  Adult Children Patient Interpreter Needed:  None Criminal Activity/Legal Involvement Pertinent to Current Situation/Hospitalization:  No - Comment as needed Significant Relationships:  Adult Children Lives with:  Adult Children Do you feel safe going back to the place where you live?  Yes Need for family participation in patient care:  Yes (Comment)  Care giving concerns:  Pt's [place] but feels that SNF is warranted and prefers [facility].  Stated that she isn't equipped to take Pt back home until Pt is able to get some rehab.   Social Worker assessment / plan:  Spoke with Pt's daughter, Diane Santana, via phone.  Mrs. Merilynn FinlandRobertson stated that she is in agreement with SNF and is interested in [facility].  [place] that [place].  Pt's daughter agreeable to [place] search [place]; they are [facility].  Employment status:  Retired Health and safety inspectornsurance information:  Medicare PT Recommendations:  Skilled Nursing Facility Information / Referral to community resources:  Skilled Nursing Facility  Patient/Family's Response to care:  Pt's daughter in agreement with SNF placement.  Patient/Family's Understanding of and Emotional Response to Diagnosis, Current Treatment, and Prognosis:  Daughter understands that SNF is  warranted and agrees with this d/c plan.  Emotional Assessment Appearance:  Appears stated age Attitude/Demeanor/Rapport:   (appropriate) Affect (typically observed):   (calm) Orientation:  Oriented to Self Alcohol / Substance use:   (n/a) Psych involvement (Current and /or in the community):  No (Comment)  Discharge Needs  Concerns to be addressed:  No discharge needs identified Readmission within the last 30 days:  No Current discharge risk:  None Barriers to Discharge:  No Barriers Identified   Lebron QuamSimpson, Kellee Sittner Amanda, LCSW 10/10/2015, 9:04 AM

## 2015-10-10 NOTE — Care Management Important Message (Signed)
Important Message  Patient Details  Name: Diane Santana MRN: [id number] Date of Birth: [birth year]   Medicare Important Message Given:  Yes    Elliot CousinShavis, Ameila Weldon Ellen, RN 10/10/2015, 3:41 PM

## 2015-10-10 NOTE — Progress Notes (Signed)
[facility] able to take Pt but not until tomorrow.  Family aware and agreeable to another facility for the night until they can transfer her tomorrow.  They want to speak with MD, however, regarding the situation.  RN to make MD aware.  [facility] is full.  [facility] facilities not willing to take Pt for one night.  RNCM met with family to discuss options.  MD agreeable to Pt staying tonight, as Pt has a bed available to her tomorrow at [facility].  Weekday CSW to facilitate d/c tomorrow to [facility].  Bernita Raisin, [facility] Social Work [phone]

## 2015-10-11 ENCOUNTER — Encounter (HOSPITAL_COMMUNITY): Payer: Self-pay | Admitting: Orthopedic Surgery

## 2015-10-11 ENCOUNTER — Inpatient Hospital Stay (HOSPITAL_COMMUNITY): Payer: Medicare Other

## 2015-10-11 LAB — CBC
HEMATOCRIT: 26.9 % — AB (ref 36.0–46.0)
Hemoglobin: 9.2 g/dL — ABNORMAL LOW (ref 12.0–15.0)
MCH: 32.2 pg (ref 26.0–34.0)
MCHC: 34.2 g/dL (ref 30.0–36.0)
MCV: 94.1 fL (ref 78.0–100.0)
Platelets: 122 10*3/uL — ABNORMAL LOW (ref 150–400)
RBC: 2.86 MIL/uL — ABNORMAL LOW (ref 3.87–5.11)
RDW: 14.6 % (ref 11.5–15.5)
WBC: 5.7 10*3/uL (ref 4.0–10.5)

## 2015-10-11 MED ORDER — ASPIRIN 325 MG PO TBEC: 325.0000 mg | DELAYED_RELEASE_TABLET | Freq: Two times a day (BID) | ORAL | Status: AC

## 2015-10-11 MED ORDER — OXYCODONE HCL 5 MG PO TABS: 5.0000 mg | ORAL_TABLET | Freq: Four times a day (QID) | ORAL | Status: AC | PRN

## 2015-10-11 NOTE — Progress Notes (Signed)
Report called to [facility] at [facility]

## 2015-10-11 NOTE — Progress Notes (Signed)
Physical Therapy Treatment Patient Details Name: Diane BoucheMary W Kimbell MRN: [id number] DOB: 12/11/1925 Today's Date: 10/11/2015    History of Present Illness 81 y/o female s/p L hip hemiarthroplasty anterior approach to repair L femoral neck fx following a fall at home. PMHx of dementia and CHF. ECG indicated L bundle branch block.     PT Comments    Pt with improved cognition able to state name, aware of children in room end of session, and maintaining limited conversational skills although off topic at times. She grimaced with movement but no noted discomfort at rest. Pt continues to require total assist for transfers secondary to posterior lean and narrow BOS. Will continue to follow to maximize function. Pt incontinent on arrival with pericare performed during session.   Follow Up Recommendations  SNF;Supervision/Assistance - 24 hour     Equipment Recommendations       Recommendations for Other Services       Precautions / Restrictions Precautions Precautions: Fall Restrictions Weight Bearing Restrictions: Yes LLE Weight Bearing: Weight bearing as tolerated    Mobility  Bed Mobility Overal bed mobility: Needs Assistance Bed Mobility: Supine to Sit     Supine to sit: Total assist;+2 for physical assistance;HOB elevated     General bed mobility comments: total assist with use of pad for helicopter technique with pad to pivot to EOB and elevate trunk  Transfers Overall transfer level: Needs assistance   Transfers: Sit to/from Stand;Stand Pivot Transfers Sit to Stand: Max assist Stand pivot transfers: Total assist       General transfer comment: Pt stood from bed, BSC and chair with total assist for anterior translation, cues for foot placement and total assist to pivot pelvis to bSC and recliner. Pt maintaining posterior lean, narrow BOS with  left foot on top to right foot at different periods  Ambulation/Gait                 Stairs            Wheelchair  Mobility    Modified Rankin (Stroke Patients Only)       Balance Overall balance assessment: Needs assistance   Sitting balance-Leahy Scale: Fair       Standing balance-Leahy Scale: Zero                      Cognition Arousal/Alertness: Awake/alert Behavior During Therapy: Flat affect Overall Cognitive Status: Impaired/Different from baseline Area of Impairment: Memory;Orientation;Following commands Orientation Level: Disoriented to;Place   Memory: Decreased short-term memory Following Commands: Follows one step commands inconsistently;Follows one step commands with increased time            Exercises General Exercises - Lower Extremity Long Arc Quad: AAROM;Both;10 reps;Seated Hip ABduction/ADduction: AAROM;Seated;Both;10 reps Hip Flexion/Marching: AAROM;Seated;Both;10 reps    General Comments        Pertinent Vitals/Pain Pain Assessment:  (PAINAD 2)    Home Living                      Prior Function            PT Goals (current goals can now be found in the care plan section) Progress towards PT goals: Progressing toward goals (slowly)    Frequency       PT Plan Current plan remains appropriate    Co-evaluation             End of Session Equipment Utilized During Treatment: Gait belt Activity Tolerance: Patient  tolerated treatment well Patient left: in chair;with call bell/phone within reach;with chair alarm set;with family/visitor present     Time: 1610-9604 PT Time Calculation (min) (ACUTE ONLY): 22 min  Charges:  $Therapeutic Activity: 8-22 mins                    G Codes:      Delorse Lek 2015/10/21, 11:22 AM Delaney Meigs, PT [phone]

## 2015-10-11 NOTE — Discharge Instructions (Signed)
°Dr. Brian Swinteck °Joint Replacement Specialist °[facility] °[address] °[place],  [address] °[phone] ° ° °TOTAL HIP REPLACEMENT POSTOPERATIVE DIRECTIONS ° ° ° °Hip Rehabilitation, Guidelines Following Surgery  ° °WEIGHT BEARING °Weight bearing as tolerated with assist device (walker, cane, etc) as directed, use it as long as suggested by your surgeon or therapist, typically at least 4-6 weeks. ° °The results of a hip operation are greatly improved after range of motion and muscle strengthening exercises. Follow all safety measures which are given to protect your hip. If any of these exercises cause increased pain or swelling in your joint, decrease the amount until you are comfortable again. Then slowly increase the exercises. Call your caregiver if you have problems or questions.  ° °HOME CARE INSTRUCTIONS  °Most of the following instructions are designed to prevent the dislocation of your new hip.  °Remove items at home which could result in a fall. This includes throw rugs or furniture in walking pathways.  °Continue medications as instructed at time of discharge. °· You may have some home medications which will be placed on hold until you complete the course of blood thinner medication. °· You may start showering once you are discharged home. Do not remove your dressing. °Do not put on socks or shoes without following the instructions of your caregivers.   °Sit on chairs with arms. Use the chair arms to help push yourself up when arising.  °Arrange for the use of a toilet seat elevator so you are not sitting low.  °· Walk with walker as instructed.  °You may resume a sexual relationship in one month or when given the OK by your caregiver.  °Use walker as long as suggested by your caregivers.  °You may put full weight on your legs and walk as much as is comfortable. °Avoid periods of inactivity such as sitting longer than an hour when not asleep. This helps prevent  blood clots.  °You may return to work once you are cleared by your surgeon.  °Do not drive a car for 6 weeks or until released by your surgeon.  °Do not drive while taking narcotics.  °Wear elastic stockings for two weeks following surgery during the day but you may remove then at night.  °Make sure you keep all of your appointments after your operation with all of your doctors and caregivers. You should call the office at the above phone number and make an appointment for approximately two weeks after the date of your surgery. °Please pick up a stool softener and laxative for home use as long as you are requiring pain medications. °· ICE to the affected hip every three hours for 30 minutes at a time and then as needed for pain and swelling. Continue to use ice on the hip for pain and swelling from surgery. You may notice swelling that will progress down to the foot and ankle.  This is normal after surgery.  Elevate the leg when you are not up walking on it.   °It is important for you to complete the blood thinner medication as prescribed by your doctor. °· Continue to use the breathing machine which will help keep your temperature down.  It is common for your temperature to cycle up and down following surgery, especially at night when you are not up moving around and exerting yourself.  The breathing machine keeps your lungs expanded and your temperature down. ° °RANGE OF MOTION AND STRENGTHENING EXERCISES  °These exercises are   designed to help you keep full movement of your hip joint. Follow your caregiver's or physical therapist's instructions. Perform all exercises about fifteen times, three times per day or as directed. Exercise both hips, even if you have had only one joint replacement. These exercises can be done on a training (exercise) mat, on the floor, on a table or on a bed. Use whatever works the best and is most comfortable for you. Use music or television while you are exercising so that the exercises  are a pleasant break in your day. This will make your life better with the exercises acting as a break in routine you can look forward to.  Lying on your back, slowly slide your foot toward your buttocks, raising your knee up off the floor. Then slowly slide your foot back down until your leg is straight again.  Lying on your back spread your legs as far apart as you can without causing discomfort.  Lying on your side, raise your upper leg and foot straight up from the floor as far as is comfortable. Slowly lower the leg and repeat.  Lying on your back, tighten up the muscle in the front of your thigh (quadriceps muscles). You can do this by keeping your leg straight and trying to raise your heel off the floor. This helps strengthen the largest muscle supporting your knee.  Lying on your back, tighten up the muscles of your buttocks both with the legs straight and with the knee bent at a comfortable angle while keeping your heel on the floor.   SKILLED REHAB INSTRUCTIONS: If the patient is transferred to a skilled rehab facility following release from the hospital, a list of the current medications will be sent to the facility for the patient to continue.  When discharged from the skilled rehab facility, please have the facility set up the patient's Home Health Physical Therapy prior to being released. Also, the skilled facility will be responsible for providing the patient with their medications at time of release from the facility to include their pain medication and their blood thinner medication. If the patient is still at the rehab facility at time of the two week follow up appointment, the skilled rehab facility will also need to assist the patient in arranging follow up appointment in our office and any transportation needs.  MAKE SURE YOU:  Understand these instructions.  Will watch your condition.  Will get help right away if you are not doing well or get worse.  Pick up stool softner and  laxative for home use following surgery while on pain medications. Do not remove your dressing. The dressing is waterproof--it is OK to take showers. Continue to use ice for pain and swelling after surgery. Do not use any lotions or creams on the incision until instructed by your surgeon. Total Hip Protocol.  Follow with Primary MD Felix Pacinienee Kuneff, DO in 7 days   Get CBC, CMP, 2 view Chest X ray checked  by Primary MD next visit.    Activity: Weightbearing as tolerated with Full fall precautions use walker/cane & assistance as needed   Disposition SNF   Diet: Dysphagia 1 Nectar Thick with feeding assistance and aspiration precautions.  For Heart failure patients - Check your Weight same time everyday, if you gain over 2 pounds, or you develop in leg swelling, experience more shortness of breath or chest pain, call your Primary MD immediately. Follow Cardiac Low Salt Diet and 1.5 lit/day fluid restriction.   On your  next visit with your primary care physician please Get Medicines reviewed and adjusted.   Please request your Prim.MD to go over all Hospital Tests and Procedure/Radiological results at the follow up, please get all Hospital records sent to your Prim MD by signing hospital release before you go home.   If you experience worsening of your admission symptoms, develop shortness of breath, life threatening emergency, suicidal or homicidal thoughts you must seek medical attention immediately by calling 911 or calling your MD immediately  if symptoms less severe.  You Must read complete instructions/literature along with all the possible adverse reactions/side effects for all the Medicines you take and that have been prescribed to you. Take any new Medicines after you have completely understood and accpet all the possible adverse reactions/side effects.   Do not drive, operating heavy machinery, perform activities at heights, swimming or participation in water activities or provide  baby sitting services if your were admitted for syncope or siezures until you have seen by Primary MD or a Neurologist and advised to do so again.  Do not drive when taking Pain medications.    Do not take more than prescribed Pain, Sleep and Anxiety Medications  Special Instructions: If you have smoked or chewed Tobacco  in the last 2 yrs please stop smoking, stop any regular Alcohol  and or any Recreational drug use.  Wear Seat belts while driving.   Please note  You were cared for by a hospitalist during your hospital stay. If you have any questions about your discharge medications or the care you received while you were in the hospital after you are discharged, you can call the unit and asked to speak with the hospitalist on call if the hospitalist that took care of you is not available. Once you are discharged, your primary care physician will handle any further medical issues. Please note that NO REFILLS for any discharge medications will be authorized once you are discharged, as it is imperative that you return to your primary care physician (or establish a relationship with a primary care physician if you do not have one) for your aftercare needs so that they can reassess your need for medications and monitor your lab values.

## 2015-10-11 NOTE — Clinical Social Work Note (Signed)
Patient will discharge today per MD order. [facility] will [facility]: [facility] RN to call report prior to transportation to: [phone] Transportation: [facility]- to be arranged between 12:30-1:30pm  CSW sent discharge summary to SNF for review.  Packet is complete.  RN, patient and family aware of discharge plans.  Vickii PennaGina Oluwatoyin Banales, LCSWA [phone]  Psychiatric & Orthopedics (5N 1-16) Clinical Social Worker     CSW spoke with daughter Okey RegalCarol who is agreeable to contacting [facility] to set up a time to complete admission's paperwork.

## 2015-10-11 NOTE — Care Management Note (Signed)
Case Management Note  Patient Details  Name: Samuel BoucheMary W Dubach MRN: [id number] Date of Birth: [birth year]  Subjective/Objective:         Admitted with left hip fracture, s/p left hip hemiarthroplasty           Action/Plan: PT recommended SNF, referral made to CSW, plan [facility] to [facility] .    Expected Discharge Date:                  Expected Discharge Plan:  Skilled Nursing Facility  In-House Referral:  Clinical Social Work  Discharge planning Services  NA  Post Acute Care Choice:    Choice offered to:     DME Arranged:    DME Agency:     HH Arranged:    HH Agency:     Status of Service:  Completed, signed off  Medicare Important Message Given:  Yes Date Medicare IM Given:    Medicare IM give by:    Date Additional Medicare IM Given:    Additional Medicare Important Message give by:     If discussed at Long Length of Stay Meetings, dates discussed:    Additional Comments:  Monica BectonKrieg, Monisha Watson, RN 10/11/2015, 10:53 AM

## 2015-10-12 ENCOUNTER — Telehealth: Payer: Self-pay | Admitting: Family Medicine

## 2015-10-12 NOTE — Telephone Encounter (Signed)
Tried contacting pt's daughter for TCM,  However when I contacted Okey RegalCarol, she said they transferred her [facility] [place] and that she would be assigned a PCP there on site.  She states that she wasn't given any paper work with discharge that she was aware of and she wasn't even told of her mothers appointment to see Dr. Claiborne BillingsKuneff that was scheduled 10/18/15.

## 2015-10-13 NOTE — Telephone Encounter (Signed)
Spoke to pt's daughter again today, she said her mother's new PCP saw pt today.  I told her to make sure that the CBC, CMP, and 2 View CXR were done per her discharge instructions.  She stated [facility] and would check.  I canceled pt's 10/18/15 appointment.

## 2015-10-18 ENCOUNTER — Ambulatory Visit: Payer: Medicare Other | Admitting: Family Medicine

## 2015-10-28 ENCOUNTER — Telehealth: Payer: Self-pay | Admitting: Family Medicine

## 2015-10-28 NOTE — Telephone Encounter (Signed)
Okey RegalCarol called to let us know that Corrie DandyMary was at [facility] sue to a hip replacement. She also received her flu shot there, Dec 2016.

## 2015-11-10 ENCOUNTER — Encounter: Payer: Self-pay | Admitting: *Deleted

## 2016-09-13 DEATH — deceased

## 2017-02-07 IMAGING — CT CT HEAD W/O CM
4 of 8 series · 15 of 47 positions shown, 17 images · non-contrast
Comparison: Head CT June 17, 2015

CLINICAL DATA: Pain following fall

EXAM:
CT HEAD WITHOUT CONTRAST
CT MAXILLOFACIAL WITHOUT CONTRAST
CT CERVICAL SPINE WITHOUT CONTRAST
TECHNIQUE: Multidetector CT imaging of the head, cervical spine, and
maxillofacial structures were performed using the standard protocol
without intravenous contrast. Multiplanar CT image reconstructions
of the cervical spine and maxillofacial structures were also
generated.

[Series 5: head bone · axial · 0.47mm/px · z∈[-99,-55]mm · 3 of 78 slices shown]
[im 12/78  bone]
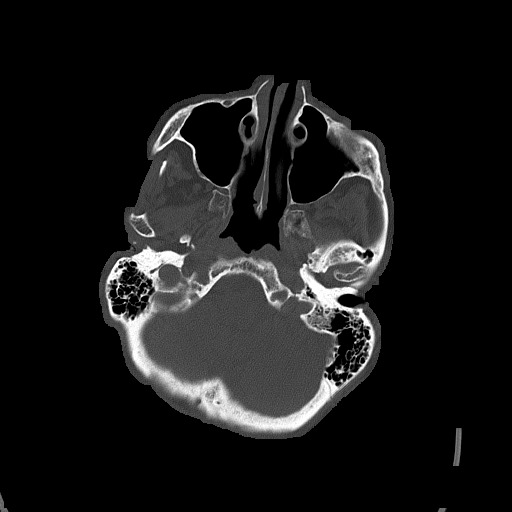
[im 23/78  bone]
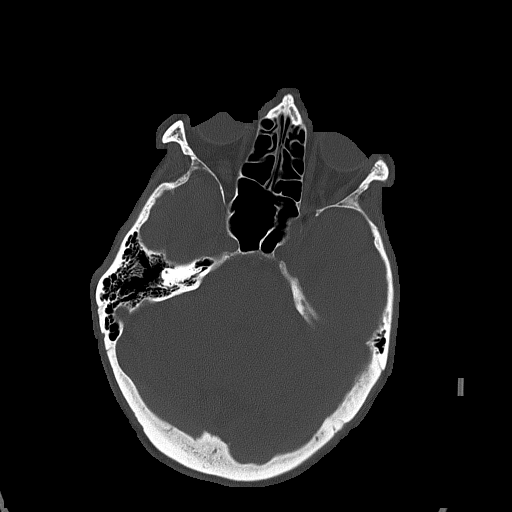
[im 34/78  bone]
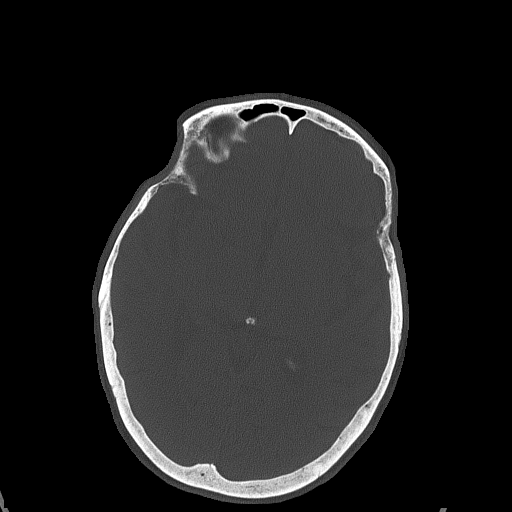

[Series 6: facialbone 2.0 st · axial · 0.38mm/px · z∈[-194,-74]mm · 7 of 81 slices shown, 9 images]
[im 11/81  brain]
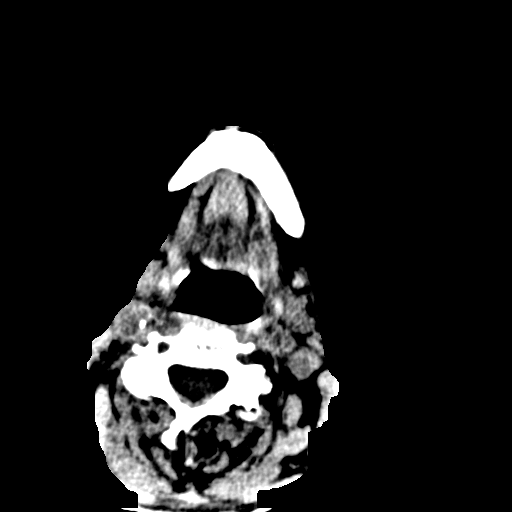
[im 11/81  bone]
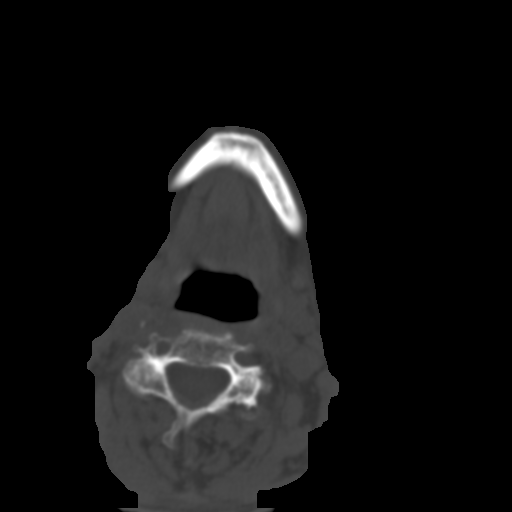
[im 21/81  brain]
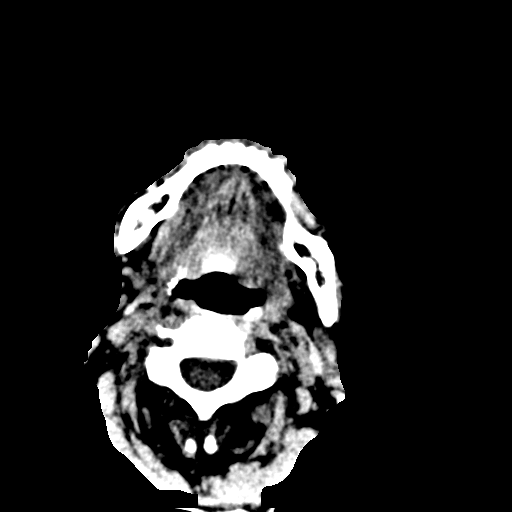
[im 31/81  brain]
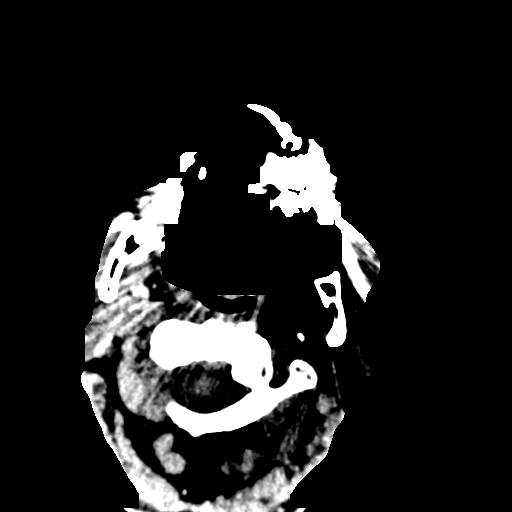
[im 41/81  brain]
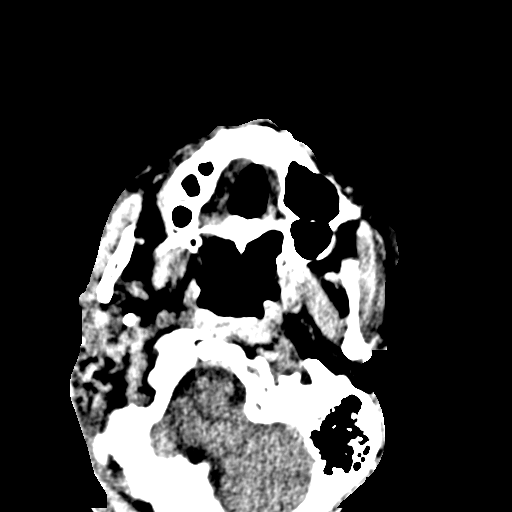
[im 51/81  brain]
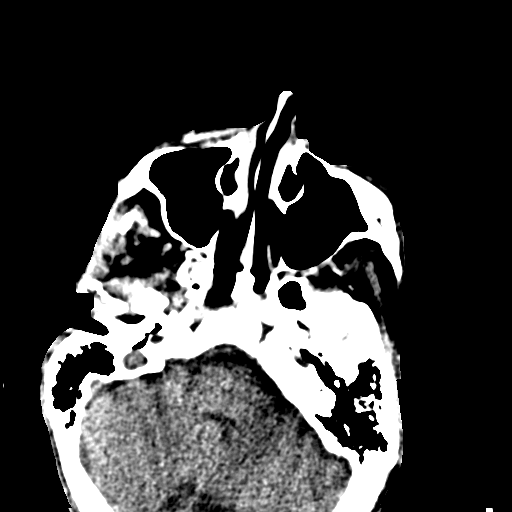
[im 51/81  bone]
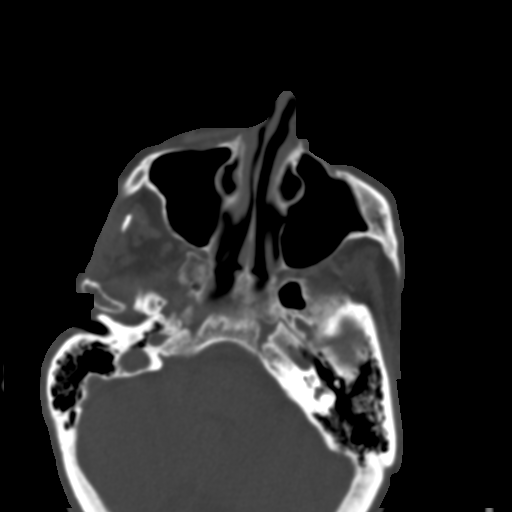
[im 61/81  brain]
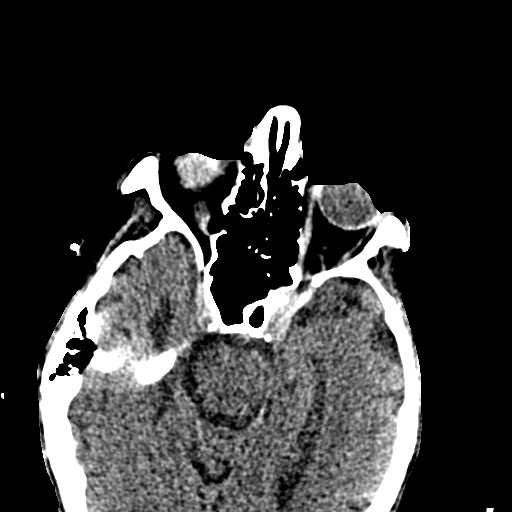
[im 71/81  brain]
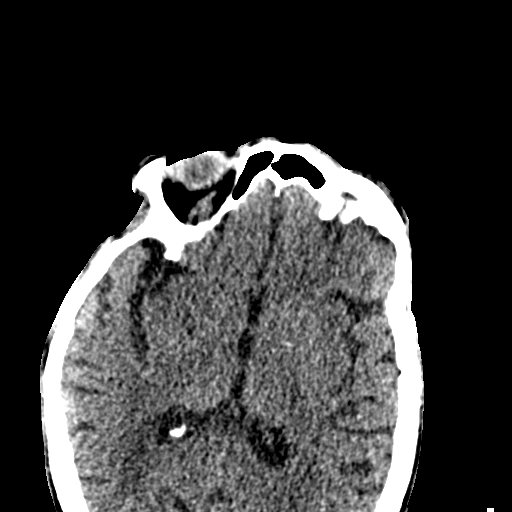

[Series 12: facialbone 2.0 cor st · coronal · 0.43mm/px · 3 of 76 slices shown]
[im 16/76  brain]
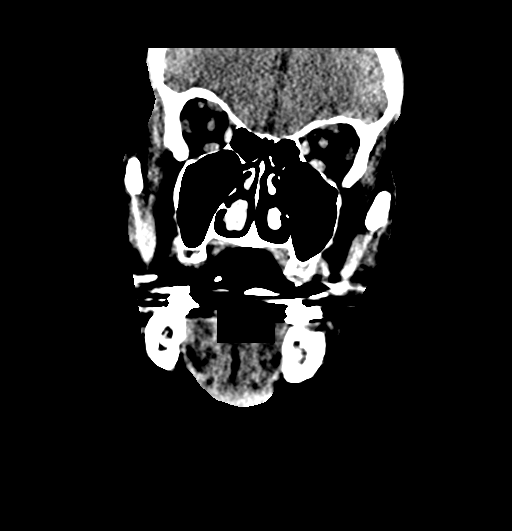
[im 31/76  brain]
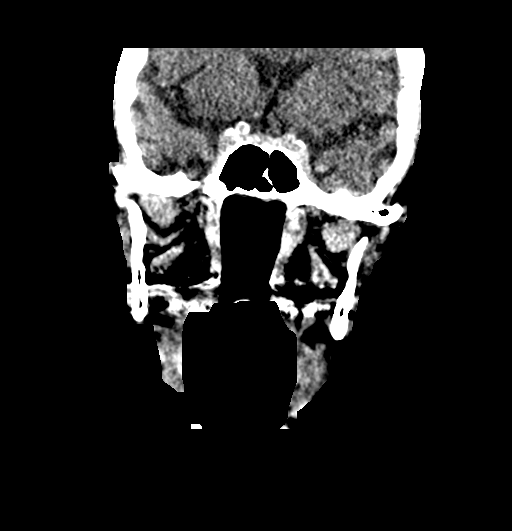
[im 46/76  brain]
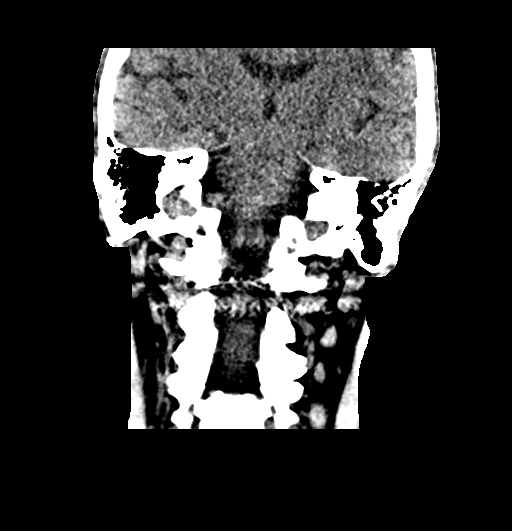

[Series 13: facialbone 2.0 sag st · sagittal · 0.53mm/px · 2 of 79 slices shown]
[im 27/79  brain]
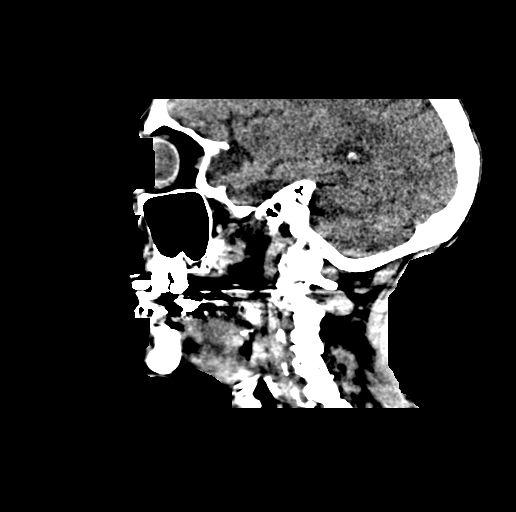
[im 53/79  brain]
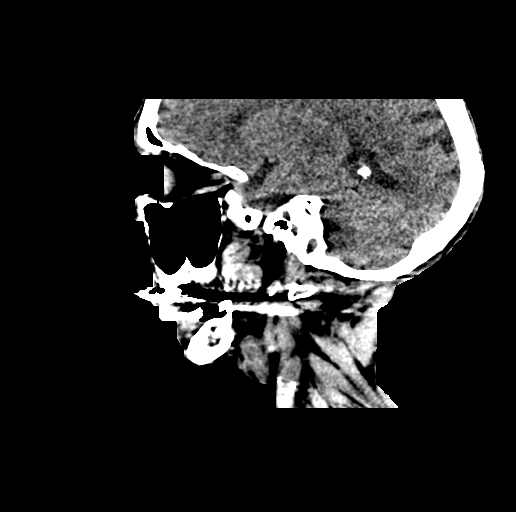

[15 of 47 positions shown; findings below may reference images not displayed]

FINDINGS: CT HEAD FINDINGS

Mild diffuse atrophy is stable. There is no intracranial mass,
hemorrhage, extra-axial fluid collection, or midline shift. There is
small vessel disease throughout the centra semiovale bilaterally,
stable. No new gray-white compartment lesions are identified. No
acute infarct evident. A small calcification in the periphery of the
mid left occipital lobe is stable [REDACTED] represent a tiny granuloma.
The bony calvarium appears intact. The mastoid air cells are clear.

CT MAXILLOFACIAL FINDINGS

There is no fracture or dislocation. The orbits appear symmetric and
normal bilaterally. There are no demonstrable intraorbital lesions.
There is opacification of a superior anterior right ethmoid air
cell. Other paranasal sinuses are clear. There is no air-fluid
level. No bony destruction or expansion. The ostiomeatal unit
complexes appear patent bilaterally. There is edema of the right
inferior nasal turbinate with narrowing of the right naris. There is
no nares obstruction. There is rightward deviation of the nasal
septum. Salivary glands appear unremarkable. Atherosclerotic changes
noted in both carotid arteries. No adenopathy is appreciable. There
is arthropathy in both temporomandibular joints.

CT CERVICAL SPINE FINDINGS

There is no fracture. Minimal retrolisthesis of C5 on C6 is felt to
be due to underlying spondylosis. There is no other
spondylolisthesis. Prevertebral soft tissues and predental space
regions are normal. There is facet hypertrophy at multiple levels
bilaterally. There is exit foraminal narrowing at multiple levels,
most pronounced on the left at C3-4, on the left at C4-5, at C5-6
bilaterally, more severe on the right than on the left, and C6-7
bilaterally, more severe on the right than on the left. There is no
frank disc extrusion or high-grade stenosis. There is calcification
in each carotid artery. There are multiple nodular lesions in the
thyroid consistent with multinodular goiter.
IMPRESSION: CT head: Atrophy with supratentorial small vessel disease, stable.
No acute infarct evident. No mass, hemorrhage, or extra-axial fluid
collection.

CT maxillofacial: No fracture or dislocation. No intraorbital
lesions. Mild right ethmoid sinus disease. Other paranasal sinuses
are clear. There is edema of the inferior right nasal turbinate with
nares narrowing on the right. Rightward deviation nasal septum.
Temporomandibular joint osteoarthritic change bilaterally.
Calcification in each carotid artery.

CT cervical spine: Multilevel osteoarthritic change. No fracture.
Minimal spondylolisthesis at C5-6 is felt to be due to underlying
spondylosis. No other spondylolisthesis.

Calcification noted in each carotid artery.

Evidence of multinodular goiter. Consider further evaluation with
thyroid ultrasound. If patient is clinically hyperthyroid, consider
nuclear medicine thyroid uptake and scan.
